# Patient Record
Sex: Female | Born: 1990 | Race: Black or African American | Hispanic: No | Marital: Single | Smoking: Never smoker
Health system: Southern US, Community
[De-identification: ages and names within clinical notes are randomized; demographics above are authoritative.]

## PROBLEM LIST (undated history)

## (undated) DIAGNOSIS — F32A Depression, unspecified: Secondary | ICD-10-CM

## (undated) DIAGNOSIS — K5900 Constipation, unspecified: Secondary | ICD-10-CM

## (undated) DIAGNOSIS — F419 Anxiety disorder, unspecified: Secondary | ICD-10-CM

## (undated) HISTORY — DX: Constipation, unspecified: K59.00

## (undated) HISTORY — PX: WISDOM TOOTH EXTRACTION: SHX21

---
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
## 2012-08-06 ENCOUNTER — Encounter (HOSPITAL_COMMUNITY): Payer: Self-pay | Admitting: Emergency Medicine

## 2012-08-06 ENCOUNTER — Emergency Department (HOSPITAL_COMMUNITY)
Admission: EM | Admit: 2012-08-06 | Discharge: 2012-08-06 | Disposition: A | Discharge status: Home / Self Care | Payer: No Typology Code available for payment source | Attending: Emergency Medicine | Admitting: Emergency Medicine

## 2012-08-06 DIAGNOSIS — Z3202 Encounter for pregnancy test, result negative: Secondary | ICD-10-CM | POA: Insufficient documentation

## 2012-08-06 DIAGNOSIS — R110 Nausea: Secondary | ICD-10-CM | POA: Insufficient documentation

## 2012-08-06 DIAGNOSIS — R109 Unspecified abdominal pain: Secondary | ICD-10-CM | POA: Insufficient documentation

## 2012-08-06 DIAGNOSIS — T7421XA Adult sexual abuse, confirmed, initial encounter: Secondary | ICD-10-CM | POA: Insufficient documentation

## 2012-08-06 LAB — PREGNANCY, URINE: Preg Test, Ur: NEGATIVE

## 2012-08-06 MED ORDER — PROMETHAZINE HCL 25 MG PO TABS
ORAL_TABLET | ORAL | Status: AC
  Filled 2012-08-06: qty 3

## 2012-08-06 MED ORDER — AZITHROMYCIN 1 G PO PACK
PACK | ORAL | Status: AC
  Administered 2012-08-06: 1 g via ORAL
  Filled 2012-08-06: qty 1

## 2012-08-06 MED ORDER — CEFIXIME 400 MG PO TABS
ORAL_TABLET | ORAL | Status: AC
  Administered 2012-08-06: 400 mg
  Filled 2012-08-06: qty 1

## 2012-08-06 MED ORDER — METRONIDAZOLE 500 MG PO TABS
ORAL_TABLET | ORAL | Status: AC
  Filled 2012-08-06: qty 4

## 2012-08-06 MED ORDER — LEVONORGESTREL 0.75 MG PO TABS
ORAL_TABLET | ORAL | Status: AC
  Administered 2012-08-06: 1.5 mg via ORAL
  Filled 2012-08-06: qty 2
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
## 2012-08-06 NOTE — ED Notes (Signed)
Talked to SANE nurse Heide Guile Rn her shift is ending and Junious Dresser will be here after 7pm
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
## 2015-10-08 ENCOUNTER — Other Ambulatory Visit (HOSPITAL_COMMUNITY): Payer: Self-pay | Admitting: Gastroenterology

## 2015-10-08 DIAGNOSIS — R110 Nausea: Secondary | ICD-10-CM

## 2015-10-08 DIAGNOSIS — R6881 Early satiety: Secondary | ICD-10-CM

## 2015-10-11 ENCOUNTER — Encounter (HOSPITAL_COMMUNITY): Payer: BLUE CROSS/BLUE SHIELD

## 2018-01-19 DIAGNOSIS — K649 Unspecified hemorrhoids: Secondary | ICD-10-CM | POA: Insufficient documentation

## 2018-02-03 DIAGNOSIS — R87610 Atypical squamous cells of undetermined significance on cytologic smear of cervix (ASC-US): Secondary | ICD-10-CM | POA: Insufficient documentation

## 2018-03-16 DIAGNOSIS — R072 Precordial pain: Secondary | ICD-10-CM | POA: Insufficient documentation

## 2018-03-16 DIAGNOSIS — N941 Unspecified dyspareunia: Secondary | ICD-10-CM | POA: Insufficient documentation

## 2019-06-27 ENCOUNTER — Other Ambulatory Visit: Payer: Self-pay

## 2019-06-27 ENCOUNTER — Encounter (HOSPITAL_COMMUNITY): Payer: Self-pay

## 2019-06-27 ENCOUNTER — Ambulatory Visit (HOSPITAL_COMMUNITY)
Admission: EM | Admit: 2019-06-27 | Discharge: 2019-06-27 | Disposition: A | Discharge status: Home / Self Care | Payer: BC Managed Care – PPO | Attending: Family Medicine | Admitting: Family Medicine

## 2019-06-27 DIAGNOSIS — R109 Unspecified abdominal pain: Secondary | ICD-10-CM | POA: Diagnosis not present

## 2019-06-27 DIAGNOSIS — R3129 Other microscopic hematuria: Secondary | ICD-10-CM | POA: Diagnosis present

## 2019-06-27 LAB — POCT URINALYSIS DIP (DEVICE)
Bilirubin Urine: NEGATIVE
Glucose, UA: NEGATIVE mg/dL
Ketones, ur: NEGATIVE mg/dL
Leukocytes,Ua: NEGATIVE
Nitrite: NEGATIVE
Protein, ur: NEGATIVE mg/dL
Specific Gravity, Urine: >=1.03 (ref 1.005–1.030)
Urobilinogen, UA: 0.2 mg/dL (ref 0.0–1.0)
pH: 7 (ref 5.0–8.0)

## 2019-06-27 LAB — BASIC METABOLIC PANEL
Anion gap: 9 (ref 5–15)
BUN: 9 mg/dL (ref 6–20)
CO2: 27 mmol/L (ref 22–32)
Calcium: 9.4 mg/dL (ref 8.9–10.3)
Chloride: 103 mmol/L (ref 98–111)
Creatinine, Ser: 0.72 mg/dL (ref 0.44–1.00)
GFR calc Af Amer: >60 mL/min (ref >60)
GFR calc non Af Amer: >60 mL/min (ref >60)
Glucose, Bld: 112 mg/dL — ABNORMAL HIGH (ref 70–99)
Potassium: 3.4 mmol/L — ABNORMAL LOW (ref 3.5–5.1)
Sodium: 139 mmol/L (ref 135–145)

## 2019-06-27 NOTE — ED Triage Notes (Signed)
Pt states she has had blood in her urine and its cloudy. X 1 month

## 2019-06-27 NOTE — Discharge Instructions (Signed)
You do still have microscopic blood to your urine but no other indications of UTI at this time.  I will check your kidney function with blood test as well as culture your urine.  Will notify of any concerning findings and if any changes to treatment are needed.  You may monitor your results on your MyChart online as well.   Ibuprofen or naproxen as needed for pain.  Please follow up with urology and/or your primary care provider for continued evaluation of your symptoms.

## 2019-06-28 ENCOUNTER — Telehealth (HOSPITAL_COMMUNITY): Payer: Self-pay | Admitting: Emergency Medicine

## 2019-06-28 LAB — URINE CULTURE: Culture: <10000 — AB

## 2019-06-28 NOTE — ED Provider Notes (Signed)
[place]    CSN: [id number] Arrival date & time: 06/27/19  1947      History   Chief Complaint Chief Complaint  Patient presents with  . Urinary Tract Infection    HPI Karen Sharp is a 28 y.o. female.   Karen Sharp presents with complaints of persistent symptoms of abdominal cramping, low back pain. Symptoms for over a month now. Has been working with her PCP without resolution or conclusion of symptoms, unfortunately. She has had microscopic hematuria with her PCP, although she was on her period and has had issues with hemorrhoids therefore it was not always reliable source of hematuria. Otherwise negative ua's and cultures. Had an abdominal CT completed which was also negative, specifically for kidney stones. She has had some urinary frequency but more often the sensation of inadequate bladder emptying. No fevers. No nausea or vomiting. Headaches. Has started to use miralax to help with constipation, which has helped. No issues with bleeding hemorrhoids today. LMP 10/16. Pelvic ultrasound 9/4 through her OB, without acute findings. Their note does mention concern for potential chronic pelvic pain, with previous ultrasound completed in may 2019. She is not taking any medications regularly, states she doesn't want to take any nsaids to mask her symptoms. Without contributing medical history.      ROS per HPI, negative if not otherwise mentioned.      History reviewed. No pertinent past medical history.  There are no active problems to display for this patient.   History reviewed. No pertinent surgical history.  OB History   No obstetric history on file.      Home Medications    Prior to Admission medications   Not on File    Family History History reviewed. No pertinent family history.  Social History Social History   Tobacco Use  . Smoking status: Never Smoker  . Smokeless tobacco: Never Used  Substance Use Topics  . Alcohol use: Yes   . Drug use: Yes    Types: Marijuana     Allergies   Patient has no known allergies.   Review of Systems Review of Systems   Physical Exam Triage Vital Signs ED Triage Vitals  Enc Vitals Group     BP 06/27/19 2020 132/82     Pulse Rate 06/27/19 2020 82     Resp 06/27/19 2020 16     Temp 06/27/19 2020 97.8 F (36.6 C)     Temp Source 06/27/19 2020 Oral     SpO2 06/27/19 2020 100 %     Weight 06/27/19 2019 113 lb (51.3 kg)     Height --      Head Circumference --      Peak Flow --      Pain Score 06/27/19 2020 5     Pain Loc --      Pain Edu? --      Excl. in [place]? --    No data found.  Updated Vital Signs BP 132/82 (BP Location: Right Arm)   Pulse 82   Temp 97.8 F (36.6 C) (Oral)   Resp 16   Wt 113 lb (51.3 kg)   LMP 06/17/2019   SpO2 100%   BMI 19.40 kg/m    Physical Exam Constitutional:      General: She is not in acute distress.    Appearance: She is well-developed.  Cardiovascular:     Rate and Rhythm: Normal rate.  Pulmonary:     Effort: Pulmonary effort is  normal.  Abdominal:     Tenderness: There is abdominal tenderness in the suprapubic area. There is no right CVA tenderness or left CVA tenderness.     Comments: Mild low abdomen/l suprapubic discomfort on exam without rebound or guarding   Skin:    General: Skin is warm and dry.  Neurological:     Mental Status: She is alert and oriented to person, place, and time.      UC Treatments / Results  Labs (all labs ordered are listed, but only abnormal results are displayed) Labs Reviewed  BASIC METABOLIC PANEL - Abnormal; Notable for the following components:      Result Value   Potassium 3.4 (*)    Glucose, Bld 112 (*)    All other components within normal limits  POCT URINALYSIS DIP (DEVICE) - Abnormal; Notable for the following components:   Hgb urine dipstick MODERATE (*)    All other components within normal limits  URINE CULTURE    EKG   Radiology No results found.   Procedures Procedures (including critical care time)  Medications Ordered in UC Medications - No data to display  Initial Impression / Assessment and Plan / UC Course  I have reviewed the triage vital signs and the nursing notes.  Pertinent labs & imaging results that were available during my care of the patient were reviewed by me and considered in my medical decision making (see chart for details).     UA still with hgb noted, negative CT for stones. No other source of infection obvious at this time, urine culture collected and pending as well. Bmp to assess creatinine as well, it is WNL. Symptoms for over a month. Afebrile. No gi symptoms. No red flag findings here tonight but patient recommended to certainly continue to work with PCP for further evaluation and management of her discomfort. Return precautions provided. Patient verbalized understanding and agreeable to plan.  Ambulatory out of clinic without difficulty.    Final Clinical Impressions(s) / UC Diagnoses   Final diagnoses:  Abdominal cramping  Microscopic hematuria     Discharge Instructions     You do still have microscopic blood to your urine but no other indications of UTI at this time.  I will check your kidney function with blood test as well as culture your urine.  Will notify of any concerning findings and if any changes to treatment are needed.  You may monitor your results on your MyChart online as well.   Ibuprofen or naproxen as needed for pain.  Please follow up with urology and/or your primary care provider for continued evaluation of your symptoms.    ED Prescriptions    None     PDMP not reviewed this encounter.   Georgetta Haber, NP 06/28/19 [phone]

## 2019-06-28 NOTE — Telephone Encounter (Signed)
Labs unremarkable, notified patient of lab work, encouraged increase in potassium diet, otherwise labs normal. All questions answered.

## 2019-11-13 ENCOUNTER — Encounter (HOSPITAL_COMMUNITY): Payer: Self-pay

## 2019-11-13 ENCOUNTER — Other Ambulatory Visit: Payer: Self-pay

## 2019-11-13 ENCOUNTER — Emergency Department (HOSPITAL_COMMUNITY): Payer: Self-pay

## 2019-11-13 ENCOUNTER — Emergency Department (HOSPITAL_COMMUNITY)
Admission: EM | Admit: 2019-11-13 | Discharge: 2019-11-13 | Disposition: A | Discharge status: Home / Self Care | Payer: Self-pay | Attending: Emergency Medicine | Admitting: Emergency Medicine

## 2019-11-13 ENCOUNTER — Ambulatory Visit (HOSPITAL_COMMUNITY)
Admission: EM | Admit: 2019-11-13 | Discharge: 2019-11-13 | Disposition: A | Discharge status: Home / Self Care | Payer: BC Managed Care – PPO

## 2019-11-13 DIAGNOSIS — W0110XA Fall on same level from slipping, tripping and stumbling with subsequent striking against unspecified object, initial encounter: Secondary | ICD-10-CM | POA: Insufficient documentation

## 2019-11-13 DIAGNOSIS — S20312A Abrasion of left front wall of thorax, initial encounter: Secondary | ICD-10-CM | POA: Insufficient documentation

## 2019-11-13 DIAGNOSIS — R42 Dizziness and giddiness: Secondary | ICD-10-CM | POA: Insufficient documentation

## 2019-11-13 DIAGNOSIS — W19XXXA Unspecified fall, initial encounter: Secondary | ICD-10-CM

## 2019-11-13 DIAGNOSIS — S0081XA Abrasion of other part of head, initial encounter: Secondary | ICD-10-CM | POA: Insufficient documentation

## 2019-11-13 DIAGNOSIS — Z23 Encounter for immunization: Secondary | ICD-10-CM | POA: Insufficient documentation

## 2019-11-13 DIAGNOSIS — R519 Headache, unspecified: Secondary | ICD-10-CM | POA: Insufficient documentation

## 2019-11-13 DIAGNOSIS — Y9389 Activity, other specified: Secondary | ICD-10-CM | POA: Insufficient documentation

## 2019-11-13 DIAGNOSIS — R110 Nausea: Secondary | ICD-10-CM | POA: Insufficient documentation

## 2019-11-13 DIAGNOSIS — Y929 Unspecified place or not applicable: Secondary | ICD-10-CM | POA: Insufficient documentation

## 2019-11-13 DIAGNOSIS — Y999 Unspecified external cause status: Secondary | ICD-10-CM | POA: Insufficient documentation

## 2019-11-13 LAB — POC URINE PREG, ED: Preg Test, Ur: NEGATIVE

## 2019-11-13 MED ORDER — KETOROLAC TROMETHAMINE 15 MG/ML IJ SOLN
15.0000 mg | Freq: Once | INTRAMUSCULAR | Status: AC
  Administered 2019-11-13: 15 mg via INTRAVENOUS
  Filled 2019-11-13: qty 1

## 2019-11-13 MED ORDER — BACITRACIN ZINC 500 UNIT/GM EX OINT: TOPICAL_OINTMENT | Freq: Two times a day (BID) | CUTANEOUS | Status: DC

## 2019-11-13 MED ORDER — SODIUM CHLORIDE 0.9 % IV BOLUS
1000.0000 mL | Freq: Once | INTRAVENOUS | Status: AC
  Administered 2019-11-13: 1000 mL via INTRAVENOUS

## 2019-11-13 MED ORDER — TETANUS-DIPHTH-ACELL PERTUSSIS 5-2.5-18.5 LF-MCG/0.5 IM SUSP
0.5000 mL | Freq: Once | INTRAMUSCULAR | Status: AC
  Administered 2019-11-13: 0.5 mL via INTRAMUSCULAR
  Filled 2019-11-13: qty 0.5

## 2019-11-13 MED ORDER — ONDANSETRON HCL 4 MG/2ML IJ SOLN
4.0000 mg | Freq: Once | INTRAMUSCULAR | Status: AC
  Administered 2019-11-13: 4 mg via INTRAVENOUS
  Filled 2019-11-13: qty 2

## 2019-11-13 NOTE — ED Notes (Signed)
Pt. Transported to xray 
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
## 2019-11-13 NOTE — ED Notes (Signed)
Pt verbalized understanding of DC instructions and follow up care.  
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
## 2019-11-24 DIAGNOSIS — L209 Atopic dermatitis, unspecified: Secondary | ICD-10-CM | POA: Insufficient documentation

## 2019-11-24 DIAGNOSIS — F4323 Adjustment disorder with mixed anxiety and depressed mood: Secondary | ICD-10-CM | POA: Insufficient documentation

## 2020-03-27 ENCOUNTER — Other Ambulatory Visit: Payer: Self-pay

## 2020-03-27 ENCOUNTER — Emergency Department (HOSPITAL_COMMUNITY)
Admission: EM | Admit: 2020-03-27 | Discharge: 2020-03-27 | Disposition: A | Discharge status: Home / Self Care | Payer: PRIVATE HEALTH INSURANCE | Attending: Emergency Medicine | Admitting: Emergency Medicine

## 2020-03-27 ENCOUNTER — Emergency Department (HOSPITAL_COMMUNITY): Payer: PRIVATE HEALTH INSURANCE

## 2020-03-27 DIAGNOSIS — G479 Sleep disorder, unspecified: Secondary | ICD-10-CM | POA: Diagnosis not present

## 2020-03-27 DIAGNOSIS — F1210 Cannabis abuse, uncomplicated: Secondary | ICD-10-CM | POA: Insufficient documentation

## 2020-03-27 DIAGNOSIS — R400 Somnolence: Secondary | ICD-10-CM

## 2020-03-27 LAB — URINALYSIS, ROUTINE W REFLEX MICROSCOPIC
Bilirubin Urine: NEGATIVE
Glucose, UA: NEGATIVE mg/dL
Ketones, ur: NEGATIVE mg/dL
Leukocytes,Ua: NEGATIVE
Nitrite: NEGATIVE
Protein, ur: NEGATIVE mg/dL
Specific Gravity, Urine: 1.025 (ref 1.005–1.030)
pH: 6 (ref 5.0–8.0)

## 2020-03-27 LAB — URINALYSIS, MICROSCOPIC (REFLEX)
Bacteria, UA: NONE SEEN
WBC, UA: NONE SEEN WBC/hpf (ref 0–5)

## 2020-03-27 LAB — CBC WITH DIFFERENTIAL/PLATELET
Abs Immature Granulocytes: 0.01 10*3/uL (ref 0.00–0.07)
Basophils Absolute: 0 10*3/uL (ref 0.0–0.1)
Basophils Relative: 0 %
Eosinophils Absolute: 0.2 10*3/uL (ref 0.0–0.5)
Eosinophils Relative: 2 %
HCT: 35.6 % — ABNORMAL LOW (ref 36.0–46.0)
Hemoglobin: 11.9 g/dL — ABNORMAL LOW (ref 12.0–15.0)
Immature Granulocytes: 0 %
Lymphocytes Relative: 36 %
Lymphs Abs: 2.7 10*3/uL (ref 0.7–4.0)
MCH: 30.5 pg (ref 26.0–34.0)
MCHC: 33.4 g/dL (ref 30.0–36.0)
MCV: 91.3 fL (ref 80.0–100.0)
Monocytes Absolute: 0.6 10*3/uL (ref 0.1–1.0)
Monocytes Relative: 8 %
Neutro Abs: 4.1 10*3/uL (ref 1.7–7.7)
Neutrophils Relative %: 54 %
Platelets: 192 10*3/uL (ref 150–400)
RBC: 3.9 MIL/uL (ref 3.87–5.11)
RDW: 12.5 % (ref 11.5–15.5)
WBC: 7.7 10*3/uL (ref 4.0–10.5)
nRBC: 0 % (ref 0.0–0.2)

## 2020-03-27 LAB — COMPREHENSIVE METABOLIC PANEL
ALT: 16 U/L (ref 0–44)
AST: 22 U/L (ref 15–41)
Albumin: 3.9 g/dL (ref 3.5–5.0)
Alkaline Phosphatase: 41 U/L (ref 38–126)
Anion gap: 7 (ref 5–15)
BUN: 11 mg/dL (ref 6–20)
CO2: 27 mmol/L (ref 22–32)
Calcium: 9.3 mg/dL (ref 8.9–10.3)
Chloride: 105 mmol/L (ref 98–111)
Creatinine, Ser: 0.62 mg/dL (ref 0.44–1.00)
GFR calc Af Amer: >60 mL/min (ref >60)
GFR calc non Af Amer: >60 mL/min (ref >60)
Glucose, Bld: 89 mg/dL (ref 70–99)
Potassium: 3.9 mmol/L (ref 3.5–5.1)
Sodium: 139 mmol/L (ref 135–145)
Total Bilirubin: 0.5 mg/dL (ref 0.3–1.2)
Total Protein: 7 g/dL (ref 6.5–8.1)

## 2020-03-27 LAB — RAPID URINE DRUG SCREEN, HOSP PERFORMED
Amphetamines: NOT DETECTED
Barbiturates: NOT DETECTED
Benzodiazepines: NOT DETECTED
Cocaine: NOT DETECTED
Opiates: NOT DETECTED
Tetrahydrocannabinol: POSITIVE — AB

## 2020-03-27 LAB — I-STAT BETA HCG BLOOD, ED (MC, WL, AP ONLY): I-stat hCG, quantitative: <5 m[IU]/mL (ref <5)
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
## 2020-03-27 NOTE — Discharge Instructions (Addendum)
Do not drive, operate heavy machinery, cook over an open flame or swim until cleared by neurology.  Avoid activities which would be dangerous if you fell asleep while doing these activities.  Return to the emergency room immediately for new or worsening symptoms or concerns, such as chest pain, shortness of breath, vomiting, fevers or any concerns at all.
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
## 2020-03-27 NOTE — ED Notes (Signed)
Patient made aware a UA is needed,stated she just used restroom prior to UA being ordered

## 2020-03-27 NOTE — ED Notes (Signed)
ISTAT beta result negative (<5). 

## 2020-09-14 ENCOUNTER — Other Ambulatory Visit: Payer: Self-pay | Admitting: Nurse Practitioner

## 2020-09-14 DIAGNOSIS — R102 Pelvic and perineal pain: Secondary | ICD-10-CM

## 2020-09-17 ENCOUNTER — Encounter: Payer: PRIVATE HEALTH INSURANCE | Admitting: Women's Health

## 2020-10-01 ENCOUNTER — Encounter: Payer: Self-pay | Admitting: Women's Health

## 2020-10-01 ENCOUNTER — Other Ambulatory Visit (HOSPITAL_COMMUNITY)
Admission: RE | Admit: 2020-10-01 | Discharge: 2020-10-01 | Disposition: A | Discharge status: Home / Self Care | Payer: Medicaid Other | Source: Ambulatory Visit | Attending: Obstetrics & Gynecology | Admitting: Obstetrics & Gynecology

## 2020-10-01 ENCOUNTER — Ambulatory Visit (INDEPENDENT_AMBULATORY_CARE_PROVIDER_SITE_OTHER): Payer: Self-pay | Admitting: Women's Health

## 2020-10-01 ENCOUNTER — Other Ambulatory Visit: Payer: Self-pay

## 2020-10-01 VITALS — BP 98/60 | HR 86 | Temp 98.2°F | Ht 63.0 in | Wt 117.3 lb

## 2020-10-01 DIAGNOSIS — M5441 Lumbago with sciatica, right side: Secondary | ICD-10-CM | POA: Diagnosis present

## 2020-10-01 DIAGNOSIS — G8929 Other chronic pain: Secondary | ICD-10-CM | POA: Diagnosis present

## 2020-10-01 DIAGNOSIS — M5442 Lumbago with sciatica, left side: Secondary | ICD-10-CM | POA: Insufficient documentation

## 2020-10-01 DIAGNOSIS — N730 Acute parametritis and pelvic cellulitis: Secondary | ICD-10-CM

## 2020-10-01 DIAGNOSIS — M545 Low back pain, unspecified: Secondary | ICD-10-CM

## 2020-10-01 DIAGNOSIS — R102 Pelvic and perineal pain: Secondary | ICD-10-CM

## 2020-10-01 LAB — POCT URINALYSIS DIPSTICK OB
Blood, UA: NEGATIVE
Glucose, UA: NEGATIVE
Ketones, UA: NEGATIVE
Leukocytes, UA: NEGATIVE
Nitrite, UA: NEGATIVE

## 2020-10-01 MED ORDER — METRONIDAZOLE 500 MG PO TABS: 500.0000 mg | ORAL_TABLET | Freq: Two times a day (BID) | ORAL | 0 refills | Status: DC

## 2020-10-01 MED ORDER — CEFTRIAXONE SODIUM 500 MG IJ SOLR
500.0000 mg | Freq: Once | INTRAMUSCULAR | Status: AC
  Administered 2020-10-01: 500 mg via INTRAMUSCULAR

## 2020-10-01 MED ORDER — DOXYCYCLINE HYCLATE 100 MG PO CAPS: 100.0000 mg | ORAL_CAPSULE | Freq: Two times a day (BID) | ORAL | 0 refills | Status: DC

## 2020-10-01 MED ORDER — CEFTRIAXONE SODIUM 250 MG IJ SOLR: 250.0000 mg | Freq: Once | INTRAMUSCULAR | Status: DC

## 2020-10-01 NOTE — Patient Instructions (Signed)
Pelvic Inflammatory Disease  Pelvic inflammatory disease (PID) is caused by an infection in some or all of the female reproductive organs. The infection can be in the uterus, ovaries, fallopian tubes, or the surrounding tissues in the pelvis. PID can cause abdominal or pelvic pain that comes on suddenly (acute pelvic pain). PID is a serious infection because it can lead to lasting (chronic) pelvic pain or the inability to have children (infertility). What are the causes? This condition is most often caused by bacteria that is spread during sexual contact. It can also be caused by a bacterial infection of the vagina (bacterial vaginosis) that is not spread by sexual contact. This condition occurs when the infection is not treated and the bacteria travel upward from the vagina or cervix into the reproductive organs. Bacteria may also be introduced into the reproductive organs following:  The birth of a baby.  A miscarriage.  An abortion.  Major pelvic surgery.  The insertion of an intrauterine device (IUD).  A sexual assault. What increases the risk? You are more likely to develop this condition if you:  Are younger than 28 years of age.  Are sexually active at a young age.  Have a history of STI (sexually transmitted infection) or PID.  Do not regularly use barrier contraception methods, such as condoms.  Have multiple sexual partners.  Have sex with someone who has symptoms of an STI.  Use a vaginal douche.  Have recently had an IUD inserted. What are the signs or symptoms? Symptoms of this condition include:  Abdominal or pelvic pain.  Fever.  Chills.  Abnormal vaginal discharge.  Abnormal uterine bleeding.  Unusual pain shortly after the end of a menstrual period.  Painful urination.  Pain with sex.  Nausea and vomiting. How is this diagnosed? This condition is diagnosed based on a pelvic exam and medical history. A pelvic exam can reveal signs of  infection, inflammation, and discharge in the vagina and the surrounding tissues. It can also help to identify painful areas. You may also have tests, such as:  Lab tests, including a pregnancy test, blood tests, and a urine test.  Culture tests of the vagina and cervix to check for an STI.  Ultrasound.  A laparoscopic procedure to look inside the pelvis.  Examination of vaginal discharge under a microscope. How is this treated? This condition may be treated with:  Antibiotic medicines taken by mouth (orally). For more severe cases, antibiotics may be given through an IV at the hospital.  Surgery. This is rare. Surgery may be needed if other treatments do not help.  Efforts to stop the spread of the infection. Sexual partners may need to be treated if the infection is caused by an STI. It may take weeks until you are completely well. If you are diagnosed with PID, you should also be checked for HIV (human immunodeficiency virus). Your health care provider may test you for infection again 3 months after treatment. You should not have unprotected sex. Follow these instructions at home:  Take over-the-counter and prescription medicines only as told by your health care provider.  If you were prescribed an antibiotic medicine, take it as told by your health care provider. Do not stop using the antibiotic even if you start to feel better.  Do not have sex until treatment is completed or as told by your health care provider. If PID is confirmed, your recent sexual partners will need treatment, especially if you had unprotected sex.  Keep all   follow-up visits as told by your health care provider. This is important. Contact a health care provider if:  You have increased or abnormal vaginal discharge.  Your pain does not improve.  You vomit.  You have a fever.  You cannot tolerate your medicines.  Your partner has an STI.  You have pain when you urinate. Get help right away  if:  You have increased abdominal or pelvic pain.  You have chills.  Your symptoms are not better in 72 hours with treatment. Summary  Pelvic inflammatory disease (PID) is caused by an infection in some or all of the female reproductive organs.  PID is a serious infection because it can lead to lasting (chronic) pelvic pain or the inability to have children (infertility).  This infection is usually treated with antibiotic medicines.  Do not have sex until treatment is completed or as told by your health care provider. This information is not intended to replace advice given to you by your health care provider. Make sure you discuss any questions you have with your health care provider. Document Revised: 05/06/2018 Document Reviewed: 05/11/2018 [employer]vier Patient Education  2021 [employer]  

## 2020-10-01 NOTE — Progress Notes (Signed)
GYN VISIT Patient name: Karen Sharp MRN [id number]  Date of birth: [birth year] Chief Complaint:   lower abd pain, severe cramps (And back pain)  History of Present Illness:   Karen Sharp is a 30 y.o. G0 African American female being seen today for report of intermittent severe low abd and low back pain x ~83mth. No fever/chills. Some nausea when pain severe. No vomiting. Uses pain patch on back which helps some. Denies abnormal discharge, itching/odor/irritation.  Doesn't like taking medicine. Concerned about fertility, was having unprotected sex w/ previous boyfriend and did not conceive. Periods are regular, 31d cycles per her app.  Depression screen [facility] 2/9 10/01/2020  Decreased Interest 0  Down, Depressed, Hopeless 0  PHQ - 2 Score 0  Altered sleeping 0  Tired, decreased energy 0  Change in appetite 0  Feeling bad or failure about yourself  0  Trouble concentrating 0  Moving slowly or fidgety/restless 0  Suicidal thoughts 0  PHQ-9 Score 0    Patient's last menstrual period was 09/30/2020. The current method of family planning is none.  Last pap 2021 in [place]. Results were: normal per her report. Does have h/o abnormal pap. Had transvaginal u/s after her pap which showed ovarian cysts Review of Systems:   Pertinent items are noted in HPI Denies fever/chills, dizziness, headaches, visual disturbances, fatigue, shortness of breath, chest pain, abdominal pain, vomiting, abnormal vaginal discharge/itching/odor/irritation, problems with periods, bowel movements, urination, or intercourse unless otherwise stated above.  Pertinent History Reviewed:  Reviewed past medical,surgical, social, obstetrical and family history.  Reviewed problem list, medications and allergies. Physical Assessment:   Vitals:   10/01/20 1150  BP: 98/60  Pulse: 86  Temp: 98.2 F (36.8 C)  Weight: 117 lb 4.8 oz (53.2 kg)  Height: 5\' 3"  (1.6 m)  Body mass index is 20.78 kg/m.       Physical  Examination:   General appearance: alert, well appearing, and in no distress  Mental status: alert, oriented to person, place, and time  Skin: warm & dry   Cardiovascular: normal heart rate noted  Respiratory: normal respiratory effort, no distress  Abdomen: soft, non-tender   Pelvic: VULVA: normal appearing vulva with no masses, tenderness or lesions, VAGINA: normal appearing vagina with normal color and discharge, no lesions, normal menstrual blood CERVIX: normal appearing cervix without discharge or lesions, +CMT  UTERUS: uterus is normal size, shape, consistency and mildly tender, ADNEXA: normal adnexa in size, mild tenderness LT >Rt, and no masses  Extremities: no edema   Chaperone:    Results for orders placed or performed in visit on 10/01/20 (from the past 24 hour(s))  POC Urinalysis Dipstick OB   Collection Time: 10/01/20 12:03 PM  Result Value Ref Range   Color, UA     Clarity, UA     Glucose, UA Negative Negative   Bilirubin, UA     Ketones, UA neg    Spec Grav, UA     Blood, UA neg    pH, UA     POC,PROTEIN,UA Small (1+) Negative, Trace, Small (1+), Moderate (2+), Large (3+), 4+   Urobilinogen, UA     Nitrite, UA neg    Leukocytes, UA Negative Negative   Appearance     Odor      Assessment & Plan:  1) Presumed PID> based on sx and exam. Rocephin 500mg  IM x 1 here, rx flagyl BID x 14d and doxy BID x 14d. CV swab sent. Let  us know if sx not improved. If worsens let us know, go to hospital for eval  2) Regular periods> q 31d on average. Not currently having sex/trying to conceive  3) H/O abnormal pap> will get records from [place]  Meds:  Meds ordered this encounter  Medications  . doxycycline (VIBRAMYCIN) 100 MG capsule    Sig: Take 1 capsule (100 mg total) by mouth 2 (two) times daily.    Dispense:  28 capsule    Refill:  0    Order Specific Question:   Supervising Provider    Answer:   EURE, LUTHER H [[id number]]  . metroNIDAZOLE (FLAGYL) 500 MG tablet     Sig: Take 1 tablet (500 mg total) by mouth 2 (two) times daily.    Dispense:  28 tablet    Refill:  0    Order Specific Question:   Supervising Provider    Answer:   Despina Hidden, LUTHER H [[id number]]  . cefTRIAXone (ROCEPHIN) injection 500 mg  . DISCONTD: cefTRIAXone (ROCEPHIN) injection 250 mg    Orders Placed This Encounter  Procedures  . POC Urinalysis Dipstick OB    Return for will call pt w/ results; get pap records from [facility].  Cheral Marker CNM, [facility] 10/01/2020 1:09 PM

## 2020-10-02 LAB — CERVICOVAGINAL ANCILLARY ONLY
Bacterial Vaginitis (gardnerella): NEGATIVE
Chlamydia: NEGATIVE
Comment: NEGATIVE
Comment: NEGATIVE
Comment: NEGATIVE
Comment: NORMAL
Neisseria Gonorrhea: NEGATIVE
Trichomonas: NEGATIVE

## 2020-10-05 ENCOUNTER — Telehealth: Payer: Self-pay | Admitting: Women's Health

## 2020-10-05 NOTE — Telephone Encounter (Signed)
Returned pt's call. Pt states she is having mouth ulcers, sore throat, burning with urination, lower back pain, cramping, headaches, irritability, and body aches, since starting the antibiotics prescribed. Informed pt a msg would be sent to the provider for advise.

## 2020-10-05 NOTE — Telephone Encounter (Signed)
Patient Called stating that she had ana appointment with kim not to long ago and she placed her on some medication, patient would like to speak with either the nurse or kim regarding the side effects that the medication is causing her. Please contact pt

## 2020-10-17 ENCOUNTER — Other Ambulatory Visit: Payer: Self-pay | Admitting: Women's Health

## 2020-10-17 ENCOUNTER — Telehealth: Payer: Self-pay | Admitting: Women's Health

## 2020-10-17 ENCOUNTER — Telehealth: Payer: Self-pay | Admitting: Radiology

## 2020-10-17 ENCOUNTER — Encounter: Payer: Self-pay | Admitting: Radiology

## 2020-10-17 MED ORDER — FLUCONAZOLE 150 MG PO TABS: 150.0000 mg | ORAL_TABLET | Freq: Once | ORAL | 0 refills | Status: AC

## 2020-10-17 NOTE — Telephone Encounter (Signed)
Called pt to inform her that her requested prescription was sent. Had to leave a vm. Pt then returned the call stating that she needed it sent to a different pharmacy. Returned pt's call to inform her that she would need to contact the pharmacy directly and set it up for a transfer. Pt's pharmacy info is changed in the chart now per pt request. Pt confirmed understanding.

## 2020-10-17 NOTE — Telephone Encounter (Signed)
Patient stated that after finishing her antibiotics she has developed a yeast infection and wants to know if a prescription could be called into her pharmacy or does she have to come in to be seen. Clinical staff will follow up with patient.

## 2020-10-17 NOTE — Telephone Encounter (Signed)
Pt would like [place] to send her Rx that we ordered today to [facility] on [address]  Please advise & notify pt

## 2020-10-17 NOTE — Telephone Encounter (Signed)
Left voicemail for patient to call [facility]  Records scanned

## 2020-11-19 ENCOUNTER — Telehealth: Payer: Self-pay | Admitting: Radiology

## 2020-11-19 NOTE — Telephone Encounter (Signed)
Spoke with Patient who was [facility] that we do not accept New Gyn patient that does not have insurance. Patient is established patient with [facility] in [place], Instructed patient to follow up with that office for pelvic pain.

## 2020-11-21 ENCOUNTER — Encounter: Payer: PRIVATE HEALTH INSURANCE | Admitting: Family Medicine

## 2021-04-01 HISTORY — PX: COLONOSCOPY: SHX174

## 2021-05-17 ENCOUNTER — Ambulatory Visit (INDEPENDENT_AMBULATORY_CARE_PROVIDER_SITE_OTHER): Payer: Self-pay | Admitting: Women's Health

## 2021-05-17 ENCOUNTER — Other Ambulatory Visit: Payer: Self-pay

## 2021-05-17 ENCOUNTER — Other Ambulatory Visit (HOSPITAL_COMMUNITY)
Admission: RE | Admit: 2021-05-17 | Discharge: 2021-05-17 | Disposition: A | Discharge status: Home / Self Care | Payer: Medicaid Other | Source: Ambulatory Visit | Attending: Women's Health | Admitting: Women's Health

## 2021-05-17 ENCOUNTER — Encounter: Payer: Self-pay | Admitting: Women's Health

## 2021-05-17 VITALS — BP 95/59 | HR 106 | Ht 63.0 in | Wt 123.4 lb

## 2021-05-17 DIAGNOSIS — Z113 Encounter for screening for infections with a predominantly sexual mode of transmission: Secondary | ICD-10-CM | POA: Diagnosis present

## 2021-05-17 DIAGNOSIS — R102 Pelvic and perineal pain: Secondary | ICD-10-CM | POA: Diagnosis present

## 2021-05-17 NOTE — Progress Notes (Signed)
   GYN VISIT Patient name: Karen Sharp MRN [id number]  Date of birth: March 03, 1991 Chief Complaint:   Pelvic Pain  History of Present Illness:   Karen Sharp is a 30 y.o. G0 African-American female being seen today for report of continued pelvic pain and low back pain.  Treated for PID in Jan, feels like tx helped sx a lot. Now having intermittent lower pelvic pain and low back pain. Happens sometimes before and after BM, sometimes randomly. Not sexually active. Some vaginal discharge, no itching/odor/irritation. Had TCS recently, removed polyp, had hemorrhoid banding. Does have h/o constipation, on miralax, has 2bm's/day now. PCP/GI has mentioned IBS.   Had normal pelvic u/s 04/25/21 ordered by PCP. Patient's last menstrual period was 05/04/2021. The current method of family planning is abstinence.  Last pap Sept 2021. Results were: NILM w/ HRHPV not done  Depression screen [facility] 2/9 10/01/2020  Decreased Interest 0  Down, Depressed, Hopeless 0  PHQ - 2 Score 0  Altered sleeping 0  Tired, decreased energy 0  Change in appetite 0  Feeling bad or failure about yourself  0  Trouble concentrating 0  Moving slowly or fidgety/restless 0  Suicidal thoughts 0  PHQ-9 Score 0     GAD 7 : Generalized Anxiety Score 10/01/2020  Nervous, Anxious, on Edge 0  Control/stop worrying 0  Worry too much - different things 0  Trouble relaxing 0  Restless 0  Easily annoyed or irritable 0  Afraid - awful might happen 0  Total GAD 7 Score 0     Review of Systems:   Pertinent items are noted in HPI Denies fever/chills, dizziness, headaches, visual disturbances, fatigue, shortness of breath, chest pain, abdominal pain, vomiting, abnormal vaginal discharge/itching/odor/irritation, problems with periods, bowel movements, urination, or intercourse unless otherwise stated above.  Pertinent History Reviewed:  Reviewed past medical,surgical, social, obstetrical and family history.  Reviewed problem list,  medications and allergies. Physical Assessment:   Vitals:   05/17/21 1016  BP: (!) 95/59  Pulse: (!) 106  Weight: 123 lb 6.4 oz (56 kg)  Height: 5\' 3"  (1.6 m)  Body mass index is 21.86 kg/m.       Physical Examination:   General appearance: alert, well appearing, and in no distress  Mental status: alert, oriented to person, place, and time  Skin: warm & dry   Cardiovascular: normal heart rate noted  Respiratory: normal respiratory effort, no distress  Abdomen: soft, non-tender   Pelvic: normal external genitalia, vulva, vagina, cervix, uterus and adnexa  Extremities: no edema   Chaperone: Peggy Dones    No results found for this or any previous visit (from the past 24 hour(s)).  Assessment & Plan:  1) Intermittent lower pelvic/back pain> normal pelvic u/s, normal exam today, CV swab sent. Discussed potential for endometriosis/trialing Orilissa. Pain does happen some around BMs, and PCP/GI have discussed possible IBS- lean more towards this dx  Meds: No orders of the defined types were placed in this encounter.   No orders of the defined types were placed in this encounter.   Return for prn.  CNM, [facility] 05/17/2021 10:47 AM

## 2021-05-20 LAB — CERVICOVAGINAL ANCILLARY ONLY
Bacterial Vaginitis (gardnerella): NEGATIVE
Candida Glabrata: NEGATIVE
Candida Vaginitis: NEGATIVE
Chlamydia: NEGATIVE
Comment: NEGATIVE
Comment: NEGATIVE
Comment: NEGATIVE
Comment: NEGATIVE
Comment: NEGATIVE
Comment: NORMAL
Neisseria Gonorrhea: NEGATIVE
Trichomonas: NEGATIVE

## 2021-07-10 ENCOUNTER — Other Ambulatory Visit (HOSPITAL_COMMUNITY)
Admission: RE | Admit: 2021-07-10 | Discharge: 2021-07-10 | Disposition: A | Discharge status: Home / Self Care | Payer: No Typology Code available for payment source | Source: Ambulatory Visit | Attending: Obstetrics & Gynecology | Admitting: Obstetrics & Gynecology

## 2021-07-10 ENCOUNTER — Other Ambulatory Visit (INDEPENDENT_AMBULATORY_CARE_PROVIDER_SITE_OTHER): Payer: No Typology Code available for payment source

## 2021-07-10 ENCOUNTER — Other Ambulatory Visit: Payer: Self-pay

## 2021-07-10 DIAGNOSIS — R399 Unspecified symptoms and signs involving the genitourinary system: Secondary | ICD-10-CM

## 2021-07-10 DIAGNOSIS — N898 Other specified noninflammatory disorders of vagina: Secondary | ICD-10-CM | POA: Diagnosis present

## 2021-07-10 DIAGNOSIS — Z113 Encounter for screening for infections with a predominantly sexual mode of transmission: Secondary | ICD-10-CM | POA: Diagnosis present

## 2021-07-10 LAB — POCT URINALYSIS DIPSTICK OB
Glucose, UA: NEGATIVE
Ketones, UA: NEGATIVE
Nitrite, UA: NEGATIVE
POC,PROTEIN,UA: NEGATIVE

## 2021-07-10 NOTE — Progress Notes (Addendum)
   NURSE VISIT- VAGINITIS/STD  SUBJECTIVE:  Karen Sharp is a 30 y.o. No obstetric history on file. GYN patientfemale here for a vaginal swab for vaginitis screening, STD screen.  She reports the following symptoms: urinary symptoms of lower abdominal pain and burning with urination and vulvar itching for 2-3 weeks. Denies abnormal vaginal bleeding, significant pelvic pain, fever, or UTI symptoms.  OBJECTIVE:  There were no vitals taken for this visit.  Appears well, in no apparent distress  ASSESSMENT: Vaginal swab for  vaginitis & STD screening  PLAN: Self-collected vaginal probe for Gonorrhea, Chlamydia, Trichomonas, Bacterial Vaginosis, Yeast sent to lab Treatment: to be determined once results are received Follow-up as needed if symptoms persist/worsen, or new symptoms develop    NURSE VISIT- UTI SYMPTOMS   SUBJECTIVE:  Karen Sharp is a 30 y.o. No obstetric history on file. female here for UTI symptoms. She is a GYN patient. She reports hematuria and burning and pain with urination .  OBJECTIVE:  There were no vitals taken for this visit.  Appears well, in no apparent distress  Results for orders placed or performed in visit on 07/10/21 (from the past 24 hour(s))  POC Urinalysis Dipstick OB   Collection Time: 07/10/21 10:54 AM  Result Value Ref Range   Color, UA     Clarity, UA     Glucose, UA Negative Negative   Bilirubin, UA     Ketones, UA neg    Spec Grav, UA     Blood, UA large    pH, UA     POC,PROTEIN,UA Negative Negative, Trace, Small (1+), Moderate (2+), Large (3+), 4+   Urobilinogen, UA     Nitrite, UA neg    Leukocytes, UA Trace (A) Negative   Appearance     Odor      ASSESSMENT: GYN patient with UTI symptoms and negative nitrites  PLAN: Discussed with Joellyn Haff, CNM, [facility]   Rx sent by provider today: No Urine culture sent Call or return to clinic prn if these symptoms worsen or fail to improve as anticipated. Follow-up: as needed    Iann Rodier A Jordon Bourquin  07/10/2021 11:06 AM   Chart reviewed for nurse visit. Agree with plan of care.  Cheral Marker, [place] 07/10/2021 12:57 PM

## 2021-07-11 LAB — CERVICOVAGINAL ANCILLARY ONLY
Bacterial Vaginitis (gardnerella): NEGATIVE
Candida Glabrata: NEGATIVE
Candida Vaginitis: NEGATIVE
Chlamydia: NEGATIVE
Comment: NEGATIVE
Comment: NEGATIVE
Comment: NEGATIVE
Comment: NEGATIVE
Comment: NEGATIVE
Comment: NORMAL
Neisseria Gonorrhea: NEGATIVE
Trichomonas: NEGATIVE

## 2021-07-15 LAB — URINE CULTURE: Organism ID, Bacteria: NO GROWTH

## 2021-07-22 ENCOUNTER — Encounter: Payer: Self-pay | Admitting: Internal Medicine

## 2021-07-22 ENCOUNTER — Ambulatory Visit (INDEPENDENT_AMBULATORY_CARE_PROVIDER_SITE_OTHER): Payer: No Typology Code available for payment source | Admitting: Internal Medicine

## 2021-07-22 ENCOUNTER — Other Ambulatory Visit: Payer: Self-pay

## 2021-07-22 VITALS — BP 118/58 | HR 80 | Resp 16 | Ht 63.0 in | Wt 128.1 lb

## 2021-07-22 DIAGNOSIS — R0789 Other chest pain: Secondary | ICD-10-CM | POA: Insufficient documentation

## 2021-07-22 DIAGNOSIS — K5909 Other constipation: Secondary | ICD-10-CM

## 2021-07-22 DIAGNOSIS — E785 Hyperlipidemia, unspecified: Secondary | ICD-10-CM

## 2021-07-22 DIAGNOSIS — R102 Pelvic and perineal pain: Secondary | ICD-10-CM

## 2021-07-22 DIAGNOSIS — R3129 Other microscopic hematuria: Secondary | ICD-10-CM

## 2021-07-22 DIAGNOSIS — G8929 Other chronic pain: Secondary | ICD-10-CM

## 2021-07-22 DIAGNOSIS — K649 Unspecified hemorrhoids: Secondary | ICD-10-CM | POA: Diagnosis not present

## 2021-07-22 DIAGNOSIS — K5900 Constipation, unspecified: Secondary | ICD-10-CM | POA: Insufficient documentation

## 2021-07-22 NOTE — Patient Instructions (Signed)
Please take Multivitamins as discussed.  Please get fasting blood tests done before the next visit.

## 2021-07-22 NOTE — Assessment & Plan Note (Signed)
Has had urology eval, CT abdomen and US abdomen negative for renal mass, stone or hydronephrosis

## 2021-07-22 NOTE — Assessment & Plan Note (Signed)
Followed by OB/GYN 

## 2021-07-22 NOTE — Assessment & Plan Note (Signed)
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  Chest pain history and chart review are not concerning for anginal pain.

## 2021-07-22 NOTE — Assessment & Plan Note (Signed)
Has had band ligation, has seen colorectal surgeon Advised to take senna or MiraLAX for constipation Needs to improve hydration

## 2021-07-22 NOTE — Progress Notes (Signed)
New Patient Office Visit  Subjective:  Patient ID: Karen Sharp, female    DOB: 1990-10-25  Age: 30 y.o. MRN: [id number]  CC:  Chief Complaint  Patient presents with   New Patient (Initial Visit)    New patient was [facility] pt has been noticing blood in her stool had colonoscopy previously also has noticed blood in her urine and having lower back pain she did have chest pains 2-3 days ago with shoulder pain     HPI Karen Sharp is a 30 y.o. female with past medical history of chronic constipation, hemorrhoids, microscopic hematuria and chronic pelvic pain who presents for establishing care.  She complains of rectal bleeding, which is intermittent.  She has had colonoscopy in 04/2021, which showed internal hemorrhoids and 1 hyperplastic polyp, which was removed.  She also has seen general surgeon, and had banding of the hemorrhoids.  She denies any melena, nausea or vomiting currently.  She has taken senna and MiraLAX for constipation, which have helped.  She also admits that she needs to improve her hydration.  She has seen urologist for microscopic hematuria.  CT abdomen in 2020 and US abdomen in 2022 was negative for renal stones or hydronephrosis.  [facility] sees [facility] for chronic pelvic pain.  She complains of chronic low back pain, which has improved with stretching exercises. Denies any numbness or tingling of the LE.  Denies any recent heavy lifting. She is concerned about pain from kidneys, which is less likely in her case.  She denies any dysuria or gross hematuria currently.  She complains of left-sided chest pain, which is near her shoulder region.  She also complains of pain in the left scapular region, which improves with ibuprofen.  Denies any dyspnea or palpitations currently.  She has had 2 doses of COVID-vaccine.  She has received flu vaccine.  History reviewed. No pertinent past medical history.  History reviewed. No pertinent  surgical history.  History reviewed. No pertinent family history.  Social History   Socioeconomic History   Marital status: Single    Spouse name: Not on file   Number of children: Not on file   Years of education: Not on file   Highest education level: Not on file  Occupational History   Not on file  Tobacco Use   Smoking status: Never   Smokeless tobacco: Never  Vaping Use   Vaping Use: Never used  Substance and Sexual Activity   Alcohol use: Not Currently   Drug use: Not Currently    Types: Marijuana   Sexual activity: Yes    Birth control/protection: None  Other Topics Concern   Not on file  Social History Narrative   Not on file   Social Determinants of Health   Financial Resource Strain: Medium Risk   Difficulty of Paying Living Expenses: Somewhat hard  Food Insecurity: No Food Insecurity   Worried About Charity fundraiser in the Last Year: Never true   Ran Out of Food in the Last Year: Never true  Transportation Needs: No Transportation Needs   Lack of Transportation (Medical): No   Lack of Transportation (Non-Medical): No  Physical Activity: Sufficiently Active   Days of Exercise per Week: 7 days   Minutes of Exercise per Session: 30 min  Stress: No Stress Concern Present   Feeling of Stress : Not at all  Social Connections: Moderately Isolated   Frequency of Communication with Friends and Family: Never  Frequency of Social Gatherings with Friends and Family: More than three times a week   Attends Religious Services: 1 to 4 times per year   Active Member of [employer] or Organizations: No   Attends Archivist Meetings: Never   Marital Status: Never married  Human resources officer Violence: Not At Risk   Fear of Current or Ex-Partner: No   Emotionally Abused: No   Physically Abused: No   Sexually Abused: No    ROS Review of Systems  Constitutional:  Negative for chills and fever.  HENT:  Negative for congestion, sinus pressure, sinus pain and sore  throat.   Eyes:  Negative for pain and discharge.  Respiratory:  Negative for cough and shortness of breath.        Left sided chest wall pain  Cardiovascular:  Negative for chest pain and palpitations.  Gastrointestinal:  Positive for blood in stool and constipation. Negative for abdominal pain, diarrhea, nausea and vomiting.  Endocrine: Negative for polydipsia and polyuria.  Genitourinary:  Negative for dysuria and hematuria.  Musculoskeletal:  Negative for neck pain and neck stiffness.  Skin:  Negative for rash.  Neurological:  Negative for dizziness and weakness.  Psychiatric/Behavioral:  Negative for agitation and behavioral problems.    Objective:   Today's Vitals: BP (!) 118/58 (BP Location: Left Arm, Patient Position: Sitting, Cuff Size: Normal)   Pulse 80   Resp 16   Ht _0  (1.6 m)   Wt 128 lb 1.3 oz (58.1 kg)   SpO2 97%   BMI 22.69 kg/m   Physical Exam Vitals reviewed.  Constitutional:      General: She is not in acute distress.    Appearance: She is not diaphoretic.  HENT:     Head: Normocephalic and atraumatic.     Nose: Nose normal.     Mouth/Throat:     Mouth: Mucous membranes are moist.  Eyes:     General: No scleral icterus.    Extraocular Movements: Extraocular movements intact.  Cardiovascular:     Rate and Rhythm: Normal rate and regular rhythm.     Pulses: Normal pulses.     Heart sounds: Normal heart sounds. No murmur heard. Pulmonary:     Breath sounds: Normal breath sounds. No wheezing or rales.  Musculoskeletal:     Cervical back: Neck supple. No tenderness.     Right lower leg: No edema.     Left lower leg: No edema.  Skin:    General: Skin is warm.     Findings: No rash.  Neurological:     General: No focal deficit present.     Mental Status: She is alert and oriented to person, place, and time.  Psychiatric:        Mood and Affect: Mood normal.        Behavior: Behavior normal.    Assessment & Plan:   Problem List Items  Addressed This Visit       Cardiovascular and Mediastinum   Hemorrhoids    Has had band ligation, has seen colorectal surgeon Advised to take senna or MiraLAX for constipation Needs to improve hydration       Relevant Orders   CBC with Differential/Platelet     Digestive   Chronic constipation - Primary    Advised to take senna or MiraLAX for constipation Needs to improve hydration      Relevant Orders   CMP14+EGFR     Genitourinary   Microscopic hematuria    Has  had urology eval, CT abdomen and US abdomen negative for renal mass, stone or hydronephrosis      Relevant Orders   Urinalysis     Other   Chest wall pain    Chest wall pain appears to be MSK in etiology Ibuprofen as needed 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  Chest pain history and chart review are not concerning for anginal pain.      Chronic pelvic pain in female    Followed by OB/GYN      Other Visit Diagnoses     Hyperlipidemia, unspecified hyperlipidemia type       Relevant Orders   Lipid Profile       Outpatient Encounter Medications as of 07/22/2021  Medication Sig   [DISCONTINUED] doxycycline (VIBRAMYCIN) 100 MG capsule Take 1 capsule (100 mg total) by mouth 2 (two) times daily. (Patient not taking: Reported on 05/17/2021)   [DISCONTINUED] hydrOXYzine (ATARAX/VISTARIL) 25 MG tablet Take 25 mg by mouth at bedtime as needed for itching. (Patient not taking: Reported on 10/01/2020)   [DISCONTINUED] metroNIDAZOLE (FLAGYL) 500 MG tablet Take 1 tablet (500 mg total) by mouth 2 (two) times daily. (Patient not taking: Reported on 05/17/2021)   [DISCONTINUED] sertraline (ZOLOFT) 25 MG tablet Take 25 mg by mouth daily. Take 1 tablet (61m total) by mouth daily for 14 days, then take 2 tablets (552mtotal) daily   No facility-administered encounter medications on file as of 07/22/2021.    Follow-up: Return in about 6 months (around 01/19/2022).    RuLindell SparMD

## 2021-07-22 NOTE — Assessment & Plan Note (Signed)
Advised to take senna or MiraLAX for constipation Needs to improve hydration 

## 2021-07-23 ENCOUNTER — Other Ambulatory Visit: Payer: Self-pay | Admitting: *Deleted

## 2021-07-23 DIAGNOSIS — Z1159 Encounter for screening for other viral diseases: Secondary | ICD-10-CM

## 2021-11-13 ENCOUNTER — Ambulatory Visit: Payer: No Typology Code available for payment source | Admitting: Family Medicine

## 2021-11-21 ENCOUNTER — Other Ambulatory Visit: Payer: Self-pay

## 2021-11-21 ENCOUNTER — Ambulatory Visit
Admission: EM | Admit: 2021-11-21 | Discharge: 2021-11-21 | Disposition: A | Discharge status: Home / Self Care | Payer: No Typology Code available for payment source | Attending: Urgent Care | Admitting: Urgent Care

## 2021-11-21 DIAGNOSIS — Z7251 High risk heterosexual behavior: Secondary | ICD-10-CM | POA: Diagnosis present

## 2021-11-21 DIAGNOSIS — N898 Other specified noninflammatory disorders of vagina: Secondary | ICD-10-CM | POA: Insufficient documentation

## 2021-11-21 DIAGNOSIS — R102 Pelvic and perineal pain: Secondary | ICD-10-CM | POA: Diagnosis present

## 2021-11-21 DIAGNOSIS — E860 Dehydration: Secondary | ICD-10-CM | POA: Insufficient documentation

## 2021-11-21 HISTORY — DX: Depression, unspecified: F32.A

## 2021-11-21 HISTORY — DX: Anxiety disorder, unspecified: F41.9

## 2021-11-21 LAB — POCT URINALYSIS DIP (MANUAL ENTRY)
Bilirubin, UA: NEGATIVE
Glucose, UA: NEGATIVE mg/dL
Leukocytes, UA: NEGATIVE
Nitrite, UA: NEGATIVE
Protein Ur, POC: NEGATIVE mg/dL
Spec Grav, UA: >=1.03 — AB (ref 1.010–1.025)
Urobilinogen, UA: 0.2 E.U./dL
pH, UA: 5.5 (ref 5.0–8.0)

## 2021-11-21 LAB — POCT URINE PREGNANCY: Preg Test, Ur: NEGATIVE

## 2021-11-21 MED ORDER — NAPROXEN 500 MG PO TABS: 500.0000 mg | ORAL_TABLET | Freq: Two times a day (BID) | ORAL | 0 refills | Status: DC

## 2021-11-21 NOTE — ED Provider Notes (Signed)
?[facility] ? ? ?MRN: [id number] DOB: [birth year] ? ?Subjective:  ? ?Karen Sharp is a 31 y.o. female presenting for 2 week history of dysuria, blood in the stool. Has had nausea without vomiting, pelvic pain, vaginal discharge. Patient is sexually active, does not use condoms for protection, has 1 female partner. LMP was beginning of February 2023. Usually has regular cycles.  Does not hydrate with water at all.  Drinks juice and ginger ale for the most part.  No coffee or alcohol. ? ?No current facility-administered medications for this encounter. ?No current outpatient medications on file.  ? ?No Known Allergies ? ?Past Medical History:  ?Diagnosis Date  ? Anxiety   ? Depression   ?  ? ?History reviewed. No pertinent surgical history. ? ?History reviewed. No pertinent family history. ? ?Social History  ? ?Tobacco Use  ? Smoking status: Never  ? Smokeless tobacco: Never  ?Substance Use Topics  ? Alcohol use: Yes  ? Drug use: Never  ? ? ?ROS ? ? ?Objective:  ? ?Vitals: ?BP 112/67 (BP Location: Right Arm)   Pulse 97   Temp 98 ?F (36.7 ?C) (Oral)   Resp 16   LMP 10/03/2021 (Exact Date)   SpO2 96%  ? ?Physical Exam ?Constitutional:   ?   General: She is not in acute distress. ?   Appearance: Normal appearance. She is well-developed. She is not ill-appearing, toxic-appearing or diaphoretic.  ?HENT:  ?   Head: Normocephalic and atraumatic.  ?   Nose: Nose normal.  ?   Mouth/Throat:  ?   Mouth: Mucous membranes are moist.  ?Eyes:  ?   General: No scleral icterus.    ?   Right eye: No discharge.     ?   Left eye: No discharge.  ?   Extraocular Movements: Extraocular movements intact.  ?   Conjunctiva/sclera: Conjunctivae normal.  ?Cardiovascular:  ?   Rate and Rhythm: Normal rate.  ?Pulmonary:  ?   Effort: Pulmonary effort is normal.  ?Abdominal:  ?   General: Bowel sounds are normal. There is no distension.  ?   Palpations: Abdomen is soft. There is no mass.  ?   Tenderness: There is no abdominal  tenderness. There is no right CVA tenderness, left CVA tenderness, guarding or rebound.  ?Skin: ?   General: Skin is warm and dry.  ?Neurological:  ?   General: No focal deficit present.  ?   Mental Status: She is alert and oriented to person, place, and time.  ?Psychiatric:     ?   Mood and Affect: Mood normal.     ?   Behavior: Behavior normal.     ?   Thought Content: Thought content normal.     ?   Judgment: Judgment normal.  ? ? ?Results for orders placed or performed during the hospital encounter of 11/21/21 (from the past 24 hour(s))  ?POCT urinalysis dipstick     Status: Abnormal  ? Collection Time: 11/21/21  2:28 PM  ?Result Value Ref Range  ? Color, UA yellow yellow  ? Clarity, UA cloudy (A) clear  ? Glucose, UA negative negative mg/dL  ? Bilirubin, UA negative negative  ? Ketones, POC UA trace (5) (A) negative mg/dL  ? Spec Grav, UA >=1.030 (A) 1.010 - 1.025  ? Blood, UA large (A) negative  ? pH, UA 5.5 5.0 - 8.0  ? Protein Ur, POC negative negative mg/dL  ? Urobilinogen, UA 0.2 0.2 or 1.0 E.U./dL  ?  Nitrite, UA Negative Negative  ? Leukocytes, UA Negative Negative  ?POCT urine pregnancy     Status: None  ? Collection Time: 11/21/21  2:30 PM  ?Result Value Ref Range  ? Preg Test, Ur Negative Negative  ? ? ?Assessment and Plan :  ? ?PDMP not reviewed this encounter. ? ?1. Pelvic pain in female   ?2. Dehydration   ?3. Unprotected sex   ?4. Pelvic pain   ?5. Vaginal discharge   ? ?Emphasized need to hydrate much better.  Use naproxen for supportive care, pain and inflammation.  Recommended waiting for results to treat based off of that for the cervical swab and RPR, HIV test.  Urine culture is pending as well.  Recheck with her gynecologist and/or PCP for general health check as patient requested general blood work.  Low suspicion for PID.  Counseled patient on potential for adverse effects with medications prescribed/recommended today, ER and return-to-clinic precautions discussed, patient verbalized  understanding. ? ?  ?Wallis Bamberg, PA-C ?11/21/21 1446 ? ?

## 2021-11-21 NOTE — ED Triage Notes (Signed)
Pt reports x 1 year; pt reports light red blood in stool sand toilet; fatigue x 3-4 weeks . States she has hemorrhoids.  ? ?Pt requested STD's test.  ? ? ?

## 2021-11-21 NOTE — Discharge Instructions (Addendum)

## 2021-11-22 ENCOUNTER — Telehealth (HOSPITAL_COMMUNITY): Payer: Self-pay | Admitting: Emergency Medicine

## 2021-11-22 ENCOUNTER — Encounter: Payer: Self-pay | Admitting: Internal Medicine

## 2021-11-22 LAB — CERVICOVAGINAL ANCILLARY ONLY
Bacterial Vaginitis (gardnerella): POSITIVE — AB
Candida Glabrata: NEGATIVE
Candida Vaginitis: POSITIVE — AB
Chlamydia: NEGATIVE
Comment: NEGATIVE
Comment: NEGATIVE
Comment: NEGATIVE
Comment: NEGATIVE
Comment: NEGATIVE
Comment: NORMAL
Neisseria Gonorrhea: NEGATIVE
Trichomonas: NEGATIVE

## 2021-11-22 LAB — RPR: RPR Ser Ql: NONREACTIVE

## 2021-11-22 LAB — HIV ANTIBODY (ROUTINE TESTING W REFLEX): HIV Screen 4th Generation wRfx: NONREACTIVE

## 2021-11-22 MED ORDER — METRONIDAZOLE 0.75 % VA GEL: 1.0000 | Freq: Every day | VAGINAL | 0 refills | Status: AC

## 2021-11-22 MED ORDER — FLUCONAZOLE 150 MG PO TABS: 150.0000 mg | ORAL_TABLET | Freq: Once | ORAL | 0 refills | Status: AC

## 2021-11-23 LAB — URINE CULTURE: Culture: NO GROWTH

## 2022-01-20 ENCOUNTER — Ambulatory Visit: Payer: No Typology Code available for payment source | Admitting: Internal Medicine

## 2022-02-19 ENCOUNTER — Other Ambulatory Visit: Payer: Self-pay | Admitting: Nurse Practitioner

## 2022-02-19 DIAGNOSIS — N644 Mastodynia: Secondary | ICD-10-CM

## 2022-02-20 ENCOUNTER — Encounter: Payer: Self-pay | Admitting: Internal Medicine

## 2022-02-20 ENCOUNTER — Ambulatory Visit (INDEPENDENT_AMBULATORY_CARE_PROVIDER_SITE_OTHER): Payer: No Typology Code available for payment source | Admitting: Internal Medicine

## 2022-02-20 VITALS — BP 102/62 | HR 96 | Resp 18 | Ht 63.0 in | Wt 139.8 lb

## 2022-02-20 DIAGNOSIS — B370 Candidal stomatitis: Secondary | ICD-10-CM

## 2022-02-20 DIAGNOSIS — K5909 Other constipation: Secondary | ICD-10-CM

## 2022-02-20 DIAGNOSIS — K649 Unspecified hemorrhoids: Secondary | ICD-10-CM

## 2022-02-20 DIAGNOSIS — Z1159 Encounter for screening for other viral diseases: Secondary | ICD-10-CM | POA: Diagnosis not present

## 2022-02-20 DIAGNOSIS — R3129 Other microscopic hematuria: Secondary | ICD-10-CM

## 2022-02-20 DIAGNOSIS — E782 Mixed hyperlipidemia: Secondary | ICD-10-CM | POA: Diagnosis not present

## 2022-02-20 MED ORDER — NYSTATIN 100000 UNIT/ML MT SUSP: 5.0000 mL | Freq: Four times a day (QID) | OROMUCOSAL | 0 refills | Status: DC

## 2022-02-20 MED ORDER — HYDROCORTISONE (PERIANAL) 2.5 % EX CREA: 1.0000 | TOPICAL_CREAM | Freq: Two times a day (BID) | CUTANEOUS | 0 refills | Status: DC

## 2022-02-20 NOTE — Assessment & Plan Note (Addendum)
Nystatin swish and spit for oral thrush Needs to take folic acid and multivitamin to avoid nutritional deficiency

## 2022-02-20 NOTE — Assessment & Plan Note (Signed)
Has had urology eval, CT abdomen and US abdomen negative for renal mass, stone or hydronephrosis Recent episode of questionable hematuria, could be related to blood mixed with urine -bleeding could be due to menstrual cycle or recent sexual activity -had recent UA at Ob/Gyn office

## 2022-02-20 NOTE — Patient Instructions (Addendum)
Please use Anusol cream for rectal irritation.  Please continue to take Miralax to avoid constipation and maintain at least 64 ounces of fluid intake in a day.  Please take Dietrich Pates' health multivitamin once daily.

## 2022-02-20 NOTE — Assessment & Plan Note (Signed)
Anusol cream as needed Has had band ligation, has seen colorectal surgeon Advised to take senna or MiraLAX for constipation Needs to improve hydration 

## 2022-02-20 NOTE — Progress Notes (Signed)
Established Patient Office Visit  Subjective:  Patient ID: Karen Sharp, female    DOB: [birth year]  Age: 31 y.o. MRN: [id number]  CC:  Chief Complaint  Patient presents with   Follow-up    6 month follow up pt is having hot flashes did see obgyn and got blood work there she did not get labs that were ordered here can have drawn today if needed also hemorrhoids have came back     HPI Karen Sharp is a 31 y.o. female with past medical history of chronic constipation, hemorrhoids, microscopic hematuria and chronic pelvic pain who presents for f/u of her chronic medical conditions.  She complains of rectal bleeding, which is intermittent.  She has had colonoscopy in 04/2021, which showed internal hemorrhoids and 1 hyperplastic polyp, which was removed.  She also has seen general surgeon, and had banding of the hemorrhoids.  She denies any melena, nausea or vomiting currently.  She has taken senna and MiraLAX for constipation, which have helped.  She also admits that she needs to improve her hydration.  She also reports hematuria, but states that she also recently started having menstrual cycle.  She also reports that she had sexual intercourse around the time when she had hematuria, and is not sure if it was related to it. She went to Ob/Gyn and had urine test done.  She also complained of hot flashes, and had TSH, FSH and LH testing at Ob/Gyn office.  She also reports whitish patch over her tongue, which she can not scrap off.    Past Medical History:  Diagnosis Date   Anxiety    Depression     History reviewed. No pertinent surgical history.  History reviewed. No pertinent family history.  Social History   Socioeconomic History   Marital status: Single    Spouse name: Not on file   Number of children: Not on file   Years of education: Not on file   Highest education level: Not on file  Occupational History   Not on file  Tobacco Use   Smoking status: Never    Smokeless tobacco: Never  Vaping Use   Vaping Use: Never used  Substance and Sexual Activity   Alcohol use: Yes   Drug use: Never    Types: Marijuana   Sexual activity: Yes    Birth control/protection: None  Other Topics Concern   Not on file  Social History Narrative   ** Merged History Encounter **       Social Determinants of Health   Financial Resource Strain: Medium Risk (10/01/2020)   Overall Financial Resource Strain (CARDIA)    Difficulty of Paying Living Expenses: Somewhat hard  Food Insecurity: No Food Insecurity (10/01/2020)   Hunger Vital Sign    Worried About Running Out of Food in the Last Year: Never true    Ran Out of Food in the Last Year: Never true  Transportation Needs: No Transportation Needs (10/01/2020)   PRAPARE - Hydrologist (Medical): No    Lack of Transportation (Non-Medical): No  Physical Activity: Sufficiently Active (10/01/2020)   Exercise Vital Sign    Days of Exercise per Week: 7 days    Minutes of Exercise per Session: 30 min  Stress: No Stress Concern Present (10/01/2020)   [place]    Feeling of Stress : Not at all  Social Connections: Moderately Isolated (10/01/2020)   Social Connection and Isolation  Panel [NHANES]    Frequency of Communication with Friends and Family: Never    Frequency of Social Gatherings with Friends and Family: More than three times a week    Attends Religious Services: 1 to 4 times per year    Active Member of [employer] or Organizations: No    Attends Archivist Meetings: Never    Marital Status: Never married  Intimate Partner Violence: Not At Risk (10/01/2020)   Humiliation, Afraid, Rape, and Kick questionnaire    Fear of Current or Ex-Partner: No    Emotionally Abused: No    Physically Abused: No    Sexually Abused: No    Outpatient Medications Prior to Visit  Medication Sig Dispense Refill   naproxen  (NAPROSYN) 500 MG tablet Take 1 tablet (500 mg total) by mouth 2 (two) times daily with a meal. (Patient not taking: Reported on 02/20/2022) 30 tablet 0   No facility-administered medications prior to visit.    Allergies  Allergen Reactions   Dog Epithelium Allergy Skin Test Hives and Rash    ROS Review of Systems  Constitutional:  Negative for chills and fever.  HENT:  Negative for congestion, sinus pressure, sinus pain and sore throat.   Eyes:  Negative for pain and discharge.  Respiratory:  Negative for cough and shortness of breath.   Cardiovascular:  Negative for chest pain and palpitations.  Gastrointestinal:  Positive for blood in stool and constipation. Negative for abdominal pain, diarrhea, nausea and vomiting.  Endocrine: Negative for polydipsia and polyuria.  Genitourinary:  Negative for dysuria and frequency.  Musculoskeletal:  Negative for neck pain and neck stiffness.  Skin:  Negative for rash.  Neurological:  Negative for dizziness and weakness.  Psychiatric/Behavioral:  Negative for agitation and behavioral problems.       Objective:    Physical Exam Vitals reviewed.  Constitutional:      General: She is not in acute distress.    Appearance: She is not diaphoretic.  HENT:     Head: Normocephalic and atraumatic.     Nose: Nose normal.     Mouth/Throat:     Mouth: Mucous membranes are moist.     Comments: Whitish patch over tongue Eyes:     General: No scleral icterus.    Extraocular Movements: Extraocular movements intact.  Cardiovascular:     Rate and Rhythm: Normal rate and regular rhythm.     Pulses: Normal pulses.     Heart sounds: Normal heart sounds. No murmur heard. Pulmonary:     Breath sounds: Normal breath sounds. No wheezing or rales.  Musculoskeletal:     Cervical back: Neck supple. No tenderness.     Right lower leg: No edema.     Left lower leg: No edema.  Skin:    General: Skin is warm.     Findings: No rash.  Neurological:      General: No focal deficit present.     Mental Status: She is alert and oriented to person, place, and time.  Psychiatric:        Mood and Affect: Mood normal.        Behavior: Behavior normal.     BP 102/62 (BP Location: Right Arm, Patient Position: Sitting, Cuff Size: Normal)   Pulse 96   Resp 18   Ht $R'5\' 3"'wl$  (1.6 m)   Wt 139 lb 12.8 oz (63.4 kg)   SpO2 98%   BMI 24.76 kg/m  Wt Readings from Last 3 Encounters:  02/20/22  139 lb 12.8 oz (63.4 kg)  07/22/21 128 lb 1.3 oz (58.1 kg)  05/17/21 123 lb 6.4 oz (56 kg)    No results found for: "TSH" Lab Results  Component Value Date   WBC 7.7 03/27/2020   HGB 11.9 (L) 03/27/2020   HCT 35.6 (L) 03/27/2020   MCV 91.3 03/27/2020   PLT 192 03/27/2020   Lab Results  Component Value Date   NA 139 03/27/2020   K 3.9 03/27/2020   CO2 27 03/27/2020   GLUCOSE 89 03/27/2020   BUN 11 03/27/2020   CREATININE 0.62 03/27/2020   BILITOT 0.5 03/27/2020   ALKPHOS 41 03/27/2020   AST 22 03/27/2020   ALT 16 03/27/2020   PROT 7.0 03/27/2020   ALBUMIN 3.9 03/27/2020   CALCIUM 9.3 03/27/2020   ANIONGAP 7 03/27/2020   No results found for: "CHOL" No results found for: "HDL" No results found for: "LDLCALC" No results found for: "TRIG" No results found for: "CHOLHDL" No results found for: "HGBA1C"    Assessment & Plan:   Problem List Items Addressed This Visit       Cardiovascular and Mediastinum   Hemorrhoids - Primary    Anusol cream as needed Has had band ligation, has seen colorectal surgeon Advised to take senna or MiraLAX for constipation Needs to improve hydration      Relevant Medications   hydrocortisone (ANUSOL-HC) 2.5 % rectal cream   Other Relevant Orders   CBC with Differential/Platelet     Digestive   Chronic constipation    Advised to take senna or MiraLAX for constipation Needs to improve hydration      Oral thrush    Nystatin swish and spit for oral thrush Needs to take folic acid and multivitamin to  avoid nutritional deficiency      Relevant Medications   nystatin (MYCOSTATIN) 100000 UNIT/ML suspension     Genitourinary   Microscopic hematuria    Has had urology eval, CT abdomen and US abdomen negative for renal mass, stone or hydronephrosis Recent episode of questionable hematuria, could be related to blood mixed with urine -bleeding could be due to menstrual cycle or recent sexual activity -had recent UA at Ob/Gyn office      Other Visit Diagnoses     Need for hepatitis C screening test       Relevant Orders   Hepatitis C Antibody   Mixed hyperlipidemia       Relevant Orders   Lipid panel   CMP14+EGFR       Meds ordered this encounter  Medications   hydrocortisone (ANUSOL-HC) 2.5 % rectal cream    Sig: Place 1 Application rectally 2 (two) times daily.    Dispense:  30 g    Refill:  0   nystatin (MYCOSTATIN) 100000 UNIT/ML suspension    Sig: Take 5 mLs (500,000 Units total) by mouth 4 (four) times daily.    Dispense:  60 mL    Refill:  0    Follow-up: Return in about 6 months (around 08/22/2022) for Annual physical.    Lindell Spar, MD

## 2022-02-20 NOTE — Assessment & Plan Note (Signed)
Advised to take senna or MiraLAX for constipation Needs to improve hydration

## 2022-02-21 LAB — CMP14+EGFR
ALT: 13 IU/L (ref 0–32)
AST: 22 IU/L (ref 0–40)
Albumin/Globulin Ratio: 1.6 (ref 1.2–2.2)
Albumin: 4.2 g/dL (ref 3.9–5.0)
Alkaline Phosphatase: 68 IU/L (ref 44–121)
BUN/Creatinine Ratio: 13 (ref 9–23)
BUN: 9 mg/dL (ref 6–20)
Bilirubin Total: 0.3 mg/dL (ref 0.0–1.2)
CO2: 26 mmol/L (ref 20–29)
Calcium: 9.4 mg/dL (ref 8.7–10.2)
Chloride: 106 mmol/L (ref 96–106)
Creatinine, Ser: 0.67 mg/dL (ref 0.57–1.00)
Globulin, Total: 2.6 g/dL (ref 1.5–4.5)
Glucose: 97 mg/dL (ref 70–99)
Potassium: 3.7 mmol/L (ref 3.5–5.2)
Sodium: 144 mmol/L (ref 134–144)
Total Protein: 6.8 g/dL (ref 6.0–8.5)
eGFR: 122 mL/min/{1.73_m2} (ref >59)

## 2022-02-21 LAB — LIPID PANEL
Chol/HDL Ratio: 3.1 ratio (ref 0.0–4.4)
Cholesterol, Total: 161 mg/dL (ref 100–199)
HDL: 52 mg/dL (ref >39)
LDL Chol Calc (NIH): 86 mg/dL (ref 0–99)
Triglycerides: 131 mg/dL (ref 0–149)
VLDL Cholesterol Cal: 23 mg/dL (ref 5–40)

## 2022-02-21 LAB — CBC WITH DIFFERENTIAL/PLATELET
Basophils Absolute: 0 10*3/uL (ref 0.0–0.2)
Basos: 0 %
EOS (ABSOLUTE): 0.1 10*3/uL (ref 0.0–0.4)
Eos: 2 %
Hematocrit: 37.1 % (ref 34.0–46.6)
Hemoglobin: 12.7 g/dL (ref 11.1–15.9)
Immature Grans (Abs): 0 10*3/uL (ref 0.0–0.1)
Immature Granulocytes: 0 %
Lymphocytes Absolute: 1.7 10*3/uL (ref 0.7–3.1)
Lymphs: 24 %
MCH: 30.2 pg (ref 26.6–33.0)
MCHC: 34.2 g/dL (ref 31.5–35.7)
MCV: 88 fL (ref 79–97)
Monocytes Absolute: 0.4 10*3/uL (ref 0.1–0.9)
Monocytes: 6 %
Neutrophils Absolute: 4.9 10*3/uL (ref 1.4–7.0)
Neutrophils: 68 %
Platelets: 236 10*3/uL (ref 150–450)
RBC: 4.21 x10E6/uL (ref 3.77–5.28)
RDW: 12.4 % (ref 11.7–15.4)
WBC: 7.1 10*3/uL (ref 3.4–10.8)

## 2022-02-21 LAB — HEPATITIS C ANTIBODY: Hep C Virus Ab: NONREACTIVE

## 2022-03-10 ENCOUNTER — Ambulatory Visit: Payer: No Typology Code available for payment source | Admitting: Internal Medicine

## 2022-03-11 ENCOUNTER — Ambulatory Visit
Admission: RE | Admit: 2022-03-11 | Discharge: 2022-03-11 | Disposition: A | Discharge status: Home / Self Care | Payer: No Typology Code available for payment source | Source: Ambulatory Visit | Attending: Nurse Practitioner | Admitting: Nurse Practitioner

## 2022-03-11 DIAGNOSIS — N644 Mastodynia: Secondary | ICD-10-CM

## 2022-04-18 ENCOUNTER — Encounter: Payer: Self-pay | Admitting: Internal Medicine

## 2022-04-21 NOTE — Telephone Encounter (Signed)
Pt appt scheduled.  Patient called office

## 2022-04-22 ENCOUNTER — Ambulatory Visit (INDEPENDENT_AMBULATORY_CARE_PROVIDER_SITE_OTHER): Payer: No Typology Code available for payment source | Admitting: Internal Medicine

## 2022-04-22 ENCOUNTER — Encounter: Payer: Self-pay | Admitting: Internal Medicine

## 2022-04-22 DIAGNOSIS — B354 Tinea corporis: Secondary | ICD-10-CM | POA: Diagnosis not present

## 2022-04-22 DIAGNOSIS — B370 Candidal stomatitis: Secondary | ICD-10-CM | POA: Diagnosis not present

## 2022-04-22 MED ORDER — NYSTATIN 100000 UNIT/ML MT SUSP: 5.0000 mL | Freq: Four times a day (QID) | OROMUCOSAL | 0 refills | Status: DC

## 2022-04-22 MED ORDER — ITRACONAZOLE 100 MG PO CAPS: 100.0000 mg | ORAL_CAPSULE | Freq: Every day | ORAL | 0 refills | Status: DC

## 2022-04-22 NOTE — Progress Notes (Signed)
Virtual Visit via Telephone Note   This visit type was conducted due to national recommendations for restrictions regarding the COVID-19 Pandemic (e.g. social distancing) in an effort to limit this patient's exposure and mitigate transmission in our community.  Due to her co-morbid illnesses, this patient is at least at moderate risk for complications without adequate follow up.  This format is felt to be most appropriate for this patient at this time.  The patient did not have access to video technology/had technical difficulties with video requiring transitioning to audio format only (telephone).  All issues noted in this document were discussed and addressed.  No physical exam could be performed with this format.  Evaluation Performed:  Follow-up visit  Date:  04/22/2022   ID:  Karen Sharp, DOB 1991-05-06, MRN [id number]  Patient Location: Home Provider Location: Office/Clinic  Participants: Patient Location of Patient: Home Location of Provider: Telehealth Consent was obtain for visit to be over via telehealth. I verified that I am speaking with the correct person using two identifiers.  PCP:  Anabel Halon, MD   Chief Complaint: Rash over trunk  History of Present Illness:    Karen Sharp is a 31 y.o. female who has a televisit for c/o rash over her chest wall, abdominal wall and back for the last 3 weeks.  She states that it is brownish in color, circular and intensely itching.  She has tried applying clotrimazole cream with some relief.  Denies any recent injury, insect bite or new chemical exposure.  She also reports having oral thrush, for which she was given nystatin suspension in the past.  She requests a refill of it.  The patient does not have symptoms concerning for COVID-19 infection (fever, chills, cough, or new shortness of breath).   Past Medical, Surgical, Social History, Allergies, and Medications have been Reviewed.  Past Medical History:   Diagnosis Date   Anxiety    Depression    No past surgical history on file.   Current Meds  Medication Sig   hydrocortisone (ANUSOL-HC) 2.5 % rectal cream Place 1 Application rectally 2 (two) times daily.   itraconazole (SPORANOX) 100 MG capsule Take 1 capsule (100 mg total) by mouth daily.   [DISCONTINUED] nystatin (MYCOSTATIN) 100000 UNIT/ML suspension Take 5 mLs (500,000 Units total) by mouth 4 (four) times daily.     Allergies:   Dog epithelium allergy skin test   ROS:   Please see the history of present illness.     All other systems reviewed and are negative.   Labs/Other Tests and Data Reviewed:    Recent Labs: 02/20/2022: ALT 13; BUN 9; Creatinine, Ser 0.67; Hemoglobin 12.7; Platelets 236; Potassium 3.7; Sodium 144   Recent Lipid Panel Lab Results  Component Value Date/Time   CHOL 161 02/20/2022 10:38 AM   TRIG 131 02/20/2022 10:38 AM   HDL 52 02/20/2022 10:38 AM   CHOLHDL 3.1 02/20/2022 10:38 AM   LDLCALC 86 02/20/2022 10:38 AM    Wt Readings from Last 3 Encounters:  02/20/22 139 lb 12.8 oz (63.4 kg)  07/22/21 128 lb 1.3 oz (58.1 kg)  05/17/21 123 lb 6.4 oz (56 kg)     ASSESSMENT & PLAN:    Tinea corporis Her rash over trunk area likely tinea corporis Started itraconazole 100 mg daily for 2 weeks Continue clotrimazole cream  Oral thrush Nystatin swish and spit for oral thrush Needs to take folic acid and multivitamin to avoid nutritional deficiency  Time:   Today, I have spent 11 minutes reviewing the chart, including problem list, medications, and with the patient with telehealth technology discussing the above problems.   Medication Adjustments/Labs and Tests Ordered: Current medicines are reviewed at length with the patient today.  Concerns regarding medicines are outlined above.   Tests Ordered: No orders of the defined types were placed in this encounter.   Medication Changes: Meds ordered this encounter  Medications   itraconazole  (SPORANOX) 100 MG capsule    Sig: Take 1 capsule (100 mg total) by mouth daily.    Dispense:  14 capsule    Refill:  0   nystatin (MYCOSTATIN) 100000 UNIT/ML suspension    Sig: Take 5 mLs (500,000 Units total) by mouth 4 (four) times daily.    Dispense:  60 mL    Refill:  0     Note: This dictation was prepared with Dragon dictation along with smaller phrase technology. Similar sounding words can be transcribed inadequately or may not be corrected upon review. Any transcriptional errors that result from this process are unintentional.      Disposition:  Follow up  Signed, Anabel Halon, MD  04/22/2022 5:37 PM     [facility]

## 2022-04-22 NOTE — Assessment & Plan Note (Signed)
Nystatin swish and spit for oral thrush Needs to take folic acid and multivitamin to avoid nutritional deficiency

## 2022-04-22 NOTE — Assessment & Plan Note (Signed)
Her rash over trunk area likely tinea corporis Started itraconazole 100 mg daily for 2 weeks Continue clotrimazole cream

## 2022-06-11 ENCOUNTER — Ambulatory Visit
Admission: EM | Admit: 2022-06-11 | Discharge: 2022-06-11 | Disposition: A | Discharge status: Home / Self Care | Payer: No Typology Code available for payment source | Attending: Family Medicine | Admitting: Family Medicine

## 2022-06-11 DIAGNOSIS — R0789 Other chest pain: Secondary | ICD-10-CM | POA: Insufficient documentation

## 2022-06-11 DIAGNOSIS — R52 Pain, unspecified: Secondary | ICD-10-CM | POA: Diagnosis present

## 2022-06-11 DIAGNOSIS — Z1152 Encounter for screening for COVID-19: Secondary | ICD-10-CM | POA: Diagnosis not present

## 2022-06-11 DIAGNOSIS — J069 Acute upper respiratory infection, unspecified: Secondary | ICD-10-CM | POA: Diagnosis present

## 2022-06-11 DIAGNOSIS — M545 Low back pain, unspecified: Secondary | ICD-10-CM

## 2022-06-11 DIAGNOSIS — Z3202 Encounter for pregnancy test, result negative: Secondary | ICD-10-CM | POA: Diagnosis not present

## 2022-06-11 LAB — POCT URINALYSIS DIP (MANUAL ENTRY)
Bilirubin, UA: NEGATIVE
Glucose, UA: NEGATIVE mg/dL
Ketones, POC UA: NEGATIVE mg/dL
Leukocytes, UA: NEGATIVE
Nitrite, UA: NEGATIVE
Protein Ur, POC: 100 mg/dL — AB
Spec Grav, UA: >=1.03 — AB (ref 1.010–1.025)
Urobilinogen, UA: 0.2 E.U./dL
pH, UA: 6 (ref 5.0–8.0)

## 2022-06-11 LAB — RESP PANEL BY RT-PCR (FLU A&B, COVID) ARPGX2
Influenza A by PCR: NEGATIVE
Influenza B by PCR: NEGATIVE
SARS Coronavirus 2 by RT PCR: NEGATIVE

## 2022-06-11 LAB — POCT URINE PREGNANCY: Preg Test, Ur: NEGATIVE

## 2022-06-11 MED ORDER — ALBUTEROL SULFATE HFA 108 (90 BASE) MCG/ACT IN AERS: 1.0000 | INHALATION_SPRAY | Freq: Four times a day (QID) | RESPIRATORY_TRACT | 0 refills | Status: DC | PRN

## 2022-06-11 NOTE — ED Provider Notes (Signed)
[facility]    CSN: [id number] Arrival date & time: 06/11/22  1607      History   Chief Complaint Chief Complaint  Patient presents with   Headache   Chills        Back Pain   Shortness of Breath         HPI Karen Sharp is a 31 y.o. female.   Patient presenting today with 1 day history of headache, chills, backaches and neck aches and some chest tightness, slight shortness of breath at times that she is noticed off and on for about a week but worse since symptoms started up today.  Unsure if she has had a fever, denies significant cough, sore throat, abdominal pain, nausea vomiting or diarrhea.  Does states she had very minimal appetite since symptoms started.  Took some ibuprofen with mild temporary relief of symptoms.  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  No known pertinent chronic medical problems per patient.    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 No pertinent surgical history.  OB History   No obstetric history on file.      Home Medications    Prior to Admission medications   Medication Sig Start Date End Date Taking? Authorizing Provider  albuterol (VENTOLIN HFA) 108 (90 Base) MCG/ACT inhaler Inhale 1-2 puffs into the lungs every 6 (six) hours as needed for wheezing or shortness of breath. 06/11/22  Yes Volney American, PA-C  ibuprofen  (ADVIL) 200 MG tablet Take 200 mg by mouth every 6 (six) hours as needed.   Yes [provider]  hydrocortisone (ANUSOL-HC) 2.5 % rectal cream Place 1 Application rectally 2 (two) times daily. 02/20/22   Lindell Spar, MD  itraconazole (SPORANOX) 100 MG capsule Take 1 capsule (100 mg total) by mouth daily. 04/22/22   Lindell Spar, MD  nystatin (MYCOSTATIN) 100000 UNIT/ML suspension Take 5 mLs (500,000 Units total) by mouth 4 (four) times daily. 04/22/22   Lindell Spar, MD    Family History History reviewed. No pertinent family history.  Social History Social History   Tobacco Use   Smoking status: Never   Smokeless tobacco: Never  Vaping Use   Vaping Use: Never used  Substance Use Topics   Alcohol use: Yes   Drug use: Never    Types: Marijuana     Allergies   Dog epithelium allergy skin test   Review of Systems Review of Systems Per HPI  Physical Exam Triage Vital Signs ED Triage Vitals  Enc Vitals Group     BP 06/11/22 1740 97/64     Pulse --      Resp 06/11/22 1740 18     Temp 06/11/22 1740 98.8 F (37.1 C)     Temp Source 06/11/22 1740 Oral     SpO2 06/11/22 1740 97 %  Weight --      Height --      Head Circumference --      Peak Flow --      Pain Score 06/11/22 1743 6     Pain Loc --      Pain Edu? --      Excl. in GC? --    No data found.  Updated Vital Signs BP 97/64 (BP Location: Right Arm)   Temp 98.8 F (37.1 C) (Oral)   Resp 18   LMP  (Within Months) Comment: 2 months  SpO2 97%   Visual Acuity Right Eye Distance:   Left Eye Distance:   Bilateral Distance:    Right Eye Near:   Left Eye Near:    Bilateral Near:     Physical Exam Vitals and nursing note reviewed.  Constitutional:      Appearance: Normal appearance.  HENT:     Head: Atraumatic.     Right Ear: Tympanic membrane and external ear normal.     Left Ear: Tympanic membrane and external ear normal.     Nose: Rhinorrhea present.     Mouth/Throat:      Mouth: Mucous membranes are moist.     Pharynx: No posterior oropharyngeal erythema.  Eyes:     Extraocular Movements: Extraocular movements intact.     Conjunctiva/sclera: Conjunctivae normal.  Cardiovascular:     Rate and Rhythm: Normal rate and regular rhythm.     Heart sounds: Normal heart sounds.  Pulmonary:     Effort: Pulmonary effort is normal.     Breath sounds: Normal breath sounds. No wheezing or rales.  Abdominal:     General: Bowel sounds are normal. There is no distension.     Palpations: Abdomen is soft.     Tenderness: There is no abdominal tenderness. There is no right CVA tenderness, left CVA tenderness or guarding.  Musculoskeletal:        General: No swelling or tenderness. Normal range of motion.     Cervical back: Normal range of motion and neck supple.  Skin:    General: Skin is warm and dry.  Neurological:     Mental Status: She is alert and oriented to person, place, and time.     Motor: No weakness.     Gait: Gait normal.  Psychiatric:        Mood and Affect: Mood normal.        Thought Content: Thought content normal.      UC Treatments / Results  Labs (all labs ordered are listed, but only abnormal results are displayed) Labs Reviewed  POCT URINALYSIS DIP (MANUAL ENTRY) - Abnormal; Notable for the following components:      Result Value   Spec Grav, UA >=1.030 (*)    Blood, UA moderate (*)    Protein Ur, POC =100 (*)    All other components within normal limits  RESP PANEL BY RT-PCR (FLU A&B, COVID) ARPGX2  POCT URINE PREGNANCY    EKG   Radiology No results found.  Procedures Procedures (including critical care time)  Medications Ordered in UC Medications - No data to display  Initial Impression / Assessment and Plan / UC Course  I have reviewed the triage vital signs and the nursing notes.  Pertinent labs & imaging results that were available during my care of the patient were reviewed by me and considered in my medical decision  making (see chart for details).     Vital signs benign  and reassuring, patient requested urinalysis due to her low back pain and this was negative for urinary tract infection and showing some possible mild dehydration.  Pregnancy also negative.  Discussed increase fluid intake, continue over-the-counter pain relievers as needed.  Suspect she is becoming sick with a viral infection.  Respiratory panel pending, albuterol inhaler given to see if this helps with her chest tightness symptoms.  Oxygen saturation 97% on room air and lungs clear to auscultation bilaterally today so reassurance given with this.  Return for any worsening symptoms.  Work note given.  Final Clinical Impressions(s) / UC Diagnoses   Final diagnoses:  Viral URI  Chest tightness  Body aches   Discharge Instructions   None    ED Prescriptions     Medication Sig Dispense Auth. Provider   albuterol (VENTOLIN HFA) 108 (90 Base) MCG/ACT inhaler Inhale 1-2 puffs into the lungs every 6 (six) hours as needed for wheezing or shortness of breath. 18 g Particia Nearing, [place]      PDMP not reviewed this encounter.   Particia Nearing, [place] 06/11/22 1835

## 2022-06-11 NOTE — ED Triage Notes (Signed)
Pt reports headache, chills started today; shortness of breath x 1 week; back pain x 2 years. Ibuprofen gives some relief with back pain.

## 2022-06-12 ENCOUNTER — Telehealth: Payer: Self-pay | Admitting: Internal Medicine

## 2022-06-12 NOTE — Telephone Encounter (Signed)
Pt needs an appt

## 2022-06-12 NOTE — Telephone Encounter (Signed)
Patient called in regard to UC visit on 10/11  Negative for covid and flu   Patient pulse is up (116) at rest . Still having pains in back and stomach ,wants a call back.

## 2022-06-13 ENCOUNTER — Other Ambulatory Visit: Payer: Self-pay

## 2022-06-13 ENCOUNTER — Ambulatory Visit (INDEPENDENT_AMBULATORY_CARE_PROVIDER_SITE_OTHER): Payer: No Typology Code available for payment source | Admitting: Internal Medicine

## 2022-06-13 ENCOUNTER — Encounter: Payer: Self-pay | Admitting: Internal Medicine

## 2022-06-13 VITALS — BP 112/62 | HR 104 | Resp 18 | Ht 63.0 in | Wt 144.0 lb

## 2022-06-13 DIAGNOSIS — Z800 Family history of malignant neoplasm of digestive organs: Secondary | ICD-10-CM | POA: Diagnosis not present

## 2022-06-13 DIAGNOSIS — K5909 Other constipation: Secondary | ICD-10-CM | POA: Diagnosis not present

## 2022-06-13 DIAGNOSIS — N946 Dysmenorrhea, unspecified: Secondary | ICD-10-CM

## 2022-06-13 DIAGNOSIS — K219 Gastro-esophageal reflux disease without esophagitis: Secondary | ICD-10-CM | POA: Diagnosis not present

## 2022-06-13 DIAGNOSIS — N926 Irregular menstruation, unspecified: Secondary | ICD-10-CM

## 2022-06-13 DIAGNOSIS — G8929 Other chronic pain: Secondary | ICD-10-CM | POA: Insufficient documentation

## 2022-06-13 DIAGNOSIS — M545 Low back pain, unspecified: Secondary | ICD-10-CM

## 2022-06-13 MED ORDER — OMEPRAZOLE 40 MG PO CPDR: 40.0000 mg | DELAYED_RELEASE_CAPSULE | Freq: Every day | ORAL | 0 refills | Status: DC

## 2022-06-13 MED ORDER — IBUPROFEN 600 MG PO TABS: 600.0000 mg | ORAL_TABLET | Freq: Three times a day (TID) | ORAL | 0 refills | Status: DC | PRN

## 2022-06-13 NOTE — Assessment & Plan Note (Addendum)
She has history of irregular menstrual cycles UPT test was negative at home Advised to get OB/GYN evaluation May be candidate for OCP Menstrual cramping or ovulation related symptoms may also cause abdominal or pelvic discomfort Ibuprofen as needed for menstrual cramping

## 2022-06-13 NOTE — Assessment & Plan Note (Signed)
Reports bilateral paraspinal area pain with positive SLR today Could be due to muscle strain/sciatica Ibuprofen as needed for back pain Heating pad and/or back brace as needed Avoid heavy lifting and frequent bending Simple back exercises material provided

## 2022-06-13 NOTE — Progress Notes (Signed)
error 

## 2022-06-13 NOTE — Assessment & Plan Note (Addendum)
Reports remote family history of pancreatic cancer in great grandparents Her CMP shows normal LFTs

## 2022-06-13 NOTE — Patient Instructions (Signed)
Please take Ibuprofen for back pain and menstrual cramps.  Please take Omeprazole for abdominal pain. Okay to take GasX or Mylanta for bloating.  Please perform simple back exercises for back pain.  Please get Ob/Gyn evaluation for menstrual abnormality.

## 2022-06-13 NOTE — Telephone Encounter (Signed)
Pt is in [place] & her meds was sent to wrong phar. Can you please resend meds from today to [facility] on [address]????

## 2022-06-13 NOTE — Assessment & Plan Note (Signed)
Her abdominal discomfort could be due to chronic constipation - concern for IBS Referred to GI Advised to take senna or MiraLAX for constipation Needs to improve hydration

## 2022-06-13 NOTE — Progress Notes (Signed)
Acute Office Visit  Subjective:    Patient ID: Karen Sharp, female    DOB: 1991-05-01, 31 y.o.   MRN: [id number]  Chief Complaint  Patient presents with   Follow-up    Follow up from urgent care 10/10 did urinalysis and strep and covid pt having back pain bloated fever and chills since 10/10    HPI Patient is in today for follow-up of recent urgent care visit for likely viral URI.  She had fever, chills, myalgias and fatigue, which have improved now.  Her flu, COVID and strep test were negative.  She denies any nasal congestion, postnasal drip, dyspnea or wheezing currently.  She still complains of abdominal pain, bloating and chronic constipation.  She has tried taking Pepto-Bismol with some relief.  She also has tried MiraLAX for chronic constipation, which helps to relieve the pressure.  She denies any relation to food intake with her symptoms.  She has mild nausea, but denies any vomiting.  She also reports low back pain and paraspinal area pain.  Denies any heavy lifting or frequent bending recently. Her UA was negative for acute infection during urgent care visit.  She has chronic microscopic hematuria, for which she has had urology evaluation in the past.  She also reports menstrual cycle irregularity, which is chronic.  She has not had menstrual period for the last 3 months.  Her urine pregnancy test was negative at home.  Denies any vaginal discharge.  Past Medical History:  Diagnosis Date   Anxiety    Depression     History reviewed. No pertinent surgical history.  History reviewed. No pertinent family history.  Social History   Socioeconomic History   Marital status: Single    Spouse name: Not on file   Number of children: Not on file   Years of education: Not on file   Highest education level: Not on file  Occupational History   Not on file  Tobacco Use   Smoking status: Never   Smokeless tobacco: Never  Vaping Use   Vaping Use: Never used  Substance and  Sexual Activity   Alcohol use: Yes   Drug use: Never    Types: Marijuana   Sexual activity: Yes    Birth control/protection: None  Other Topics Concern   Not on file  Social History Narrative   ** Merged History Encounter **       Social Determinants of Health   Financial Resource Strain: Medium Risk (10/01/2020)   Overall Financial Resource Strain (CARDIA)    Difficulty of Paying Living Expenses: Somewhat hard  Food Insecurity: No Food Insecurity (10/01/2020)   Hunger Vital Sign    Worried About Running Out of Food in the Last Year: Never true    Ran Out of Food in the Last Year: Never true  Transportation Needs: No Transportation Needs (10/01/2020)   PRAPARE - Administrator, Civil Service (Medical): No    Lack of Transportation (Non-Medical): No  Physical Activity: Sufficiently Active (10/01/2020)   Exercise Vital Sign    Days of Exercise per Week: 7 days    Minutes of Exercise per Session: 30 min  Stress: No Stress Concern Present (10/01/2020)   [employer] of Occupational Health - Occupational Stress Questionnaire    Feeling of Stress : Not at all  Social Connections: Moderately Isolated (10/01/2020)   Social Connection and Isolation Panel [NHANES]    Frequency of Communication with Friends and Family: Never    Frequency of  Social Gatherings with Friends and Family: More than three times a week    Attends Religious Services: 1 to 4 times per year    Active Member of [employer] or Organizations: No    Attends Archivist Meetings: Never    Marital Status: Never married  Intimate Partner Violence: Not At Risk (10/01/2020)   Humiliation, Afraid, Rape, and Kick questionnaire    Fear of Current or Ex-Partner: No    Emotionally Abused: No    Physically Abused: No    Sexually Abused: No    Outpatient Medications Prior to Visit  Medication Sig Dispense Refill   albuterol (VENTOLIN HFA) 108 (90 Base) MCG/ACT inhaler Inhale 1-2 puffs into the lungs every 6  (six) hours as needed for wheezing or shortness of breath. 18 g 0   hydrocortisone (ANUSOL-HC) 2.5 % rectal cream Place 1 Application rectally 2 (two) times daily. 30 g 0   nystatin (MYCOSTATIN) 100000 UNIT/ML suspension Take 5 mLs (500,000 Units total) by mouth 4 (four) times daily. 60 mL 0   ibuprofen (ADVIL) 200 MG tablet Take 200 mg by mouth every 6 (six) hours as needed.     itraconazole (SPORANOX) 100 MG capsule Take 1 capsule (100 mg total) by mouth daily. 14 capsule 0   No facility-administered medications prior to visit.    Allergies  Allergen Reactions   Dog Epithelium Allergy Skin Test Hives and Rash    Review of Systems  Constitutional:  Negative for chills and fever.  HENT:  Negative for congestion, sinus pressure, sinus pain and sore throat.   Eyes:  Negative for pain and discharge.  Respiratory:  Negative for cough and shortness of breath.   Cardiovascular:  Negative for chest pain and palpitations.  Gastrointestinal:  Positive for abdominal pain and constipation. Negative for diarrhea, nausea and vomiting.  Endocrine: Negative for polydipsia and polyuria.  Genitourinary:  Negative for dysuria and frequency.  Musculoskeletal:  Positive for back pain. Negative for neck pain and neck stiffness.  Skin:  Negative for rash.  Neurological:  Negative for dizziness and weakness.  Psychiatric/Behavioral:  Negative for agitation and behavioral problems.        Objective:    Physical Exam Vitals reviewed.  Constitutional:      General: She is not in acute distress.    Appearance: She is not diaphoretic.  HENT:     Head: Normocephalic and atraumatic.     Nose: Nose normal.  Eyes:     General: No scleral icterus.    Extraocular Movements: Extraocular movements intact.  Cardiovascular:     Rate and Rhythm: Normal rate and regular rhythm.     Pulses: Normal pulses.     Heart sounds: Normal heart sounds. No murmur heard. Pulmonary:     Breath sounds: Normal breath  sounds. No wheezing or rales.  Abdominal:     Palpations: Abdomen is soft.     Tenderness: There is no abdominal tenderness. There is no right CVA tenderness or left CVA tenderness.  Musculoskeletal:     Cervical back: Neck supple. No tenderness.     Right lower leg: No edema.     Left lower leg: No edema.     Comments: SLR positive b/l  Skin:    General: Skin is warm.     Findings: No rash.  Neurological:     General: No focal deficit present.     Mental Status: She is alert and oriented to person, place, and time.  Psychiatric:  Mood and Affect: Mood normal.        Behavior: Behavior normal.     BP 112/62 (BP Location: Right Arm, Patient Position: Sitting, Cuff Size: Normal)   Pulse (!) 104   Resp 18   Ht 5\' 3"  (1.6 m)   Wt 144 lb (65.3 kg)   LMP  (Within Months)   SpO2 98%   BMI 25.51 kg/m  Wt Readings from Last 3 Encounters:  06/13/22 144 lb (65.3 kg)  02/20/22 139 lb 12.8 oz (63.4 kg)  07/22/21 128 lb 1.3 oz (58.1 kg)        Assessment & Plan:   Problem List Items Addressed This Visit       Digestive   Chronic constipation - Primary    Her abdominal discomfort could be due to chronic constipation - concern for IBS Referred to GI Advised to take senna or MiraLAX for constipation Needs to improve hydration      Relevant Orders   Ambulatory referral to Gastroenterology   Gastroesophageal reflux disease    Epigastric pain could be due to GERD/gastritis Started omeprazole Referred to GI      Relevant Medications   omeprazole (PRILOSEC) 40 MG capsule     Other   Family history of pancreatic cancer    Reports remote family history of pancreatic cancer in great grandparents Her CMP shows normal LFTs       Irregular menstruation    She has history of irregular menstrual cycles UPT test was negative at home Advised to get OB/GYN evaluation May be candidate for OCP Menstrual cramping or ovulation related symptoms may also cause abdominal or  pelvic discomfort Ibuprofen as needed for menstrual cramping      Chronic bilateral low back pain without sciatica    Reports bilateral paraspinal area pain with positive SLR today Could be due to muscle strain/sciatica Ibuprofen as needed for back pain Heating pad and/or back brace as needed Avoid heavy lifting and frequent bending Simple back exercises material provided      Relevant Medications   ibuprofen (ADVIL) 600 MG tablet   Other Visit Diagnoses     Menstrual cramp       Relevant Medications   ibuprofen (ADVIL) 600 MG tablet        Meds ordered this encounter  Medications   ibuprofen (ADVIL) 600 MG tablet    Sig: Take 1 tablet (600 mg total) by mouth every 8 (eight) hours as needed.    Dispense:  30 tablet    Refill:  0   omeprazole (PRILOSEC) 40 MG capsule    Sig: Take 1 capsule (40 mg total) by mouth daily.    Dispense:  30 capsule    Refill:  0     Clarence Dunsmore 07/24/21, MD

## 2022-06-13 NOTE — Assessment & Plan Note (Signed)
Epigastric pain could be due to GERD/gastritis Started omeprazole Referred to GI

## 2022-06-18 ENCOUNTER — Encounter: Payer: Self-pay | Admitting: *Deleted

## 2022-07-20 NOTE — Progress Notes (Deleted)
GI Office Note    Referring Provider: Anabel Halon, MD Primary Care Physician:  Anabel Halon, MD  Primary Gastroenterologist: ***  Chief Complaint   No chief complaint on file.   History of Present Illness   Karen Sharp is a 31 y.o. female presenting today at the request of Anabel Halon, MD for ***constipation.   Previously seen [facility]) in June 2022. Seen for abdominal pain, constipation, and hemorrhoids. RUQ pain. Reported 2-3 BM daily. Haard stools and needed to strain. Pain with Bms. Taking miralax 17g daily but still with hard stools. 2 aunts with pancreatic cancer. Mother with cirrhosis and HCC. Colonoscopy scheduled. Abdominal xray ordered. Advised referral to colorectal surgery for hemorrhoid management. Advised hydration and titrating up miralax. Also adding in fiber supplement. If negative workup then perform sits marker study vs anorectal manometry. Future considerations: Linzess and anorectal manometry.   Colonoscopy 04/19/21: - 3 mm rectosigmoid polyp (hyperplastic) - non bleeding internal hemorrhoids (grade 1) - repeat as needed or at age 30  Labs 02/20/22: CBC, CMP, Lipid panel all within normal limits. Negative HCV.   PCP visit 06/13/22: Epigastric abdominal pain, bloating, constipation. Tried pepto-bismol with some relief. Tried miralax which helps relieve pressure. Mild nausea, no vomiting. Advised senna or miralax, improve hydration. Given omeprazole 40mg  for possible GERD/gastritis.   Today: Epigastric pain -   Constipation -     Current Outpatient Medications  Medication Sig Dispense Refill   albuterol (VENTOLIN HFA) 108 (90 Base) MCG/ACT inhaler Inhale 1-2 puffs into the lungs every 6 (six) hours as needed for wheezing or shortness of breath. 18 g 0   hydrocortisone (ANUSOL-HC) 2.5 % rectal cream Place 1 Application rectally 2 (two) times daily. 30 g 0   ibuprofen (ADVIL) 600 MG tablet Take 1 tablet  (600 mg total) by mouth every 8 (eight) hours as needed. 30 tablet 0   nystatin (MYCOSTATIN) 100000 UNIT/ML suspension Take 5 mLs (500,000 Units total) by mouth 4 (four) times daily. 60 mL 0   omeprazole (PRILOSEC) 40 MG capsule Take 1 capsule (40 mg total) by mouth daily. 30 capsule 0   No current facility-administered medications for this visit.    Past Medical History:  Diagnosis Date   Anxiety    Depression     No past surgical history on file.  No family history on file.  Allergies as of 07/21/2022 - Review Complete 06/13/2022  Allergen Reaction Noted   Dog epithelium allergy skin test Hives and Rash 11/24/2019    Social History   Socioeconomic History   Marital status: Single    Spouse name: Not on file   Number of children: Not on file   Years of education: Not on file   Highest education level: Not on file  Occupational History   Not on file  Tobacco Use   Smoking status: Never   Smokeless tobacco: Never  Vaping Use   Vaping Use: Never used  Substance and Sexual Activity   Alcohol use: Yes   Drug use: Never    Types: Marijuana   Sexual activity: Yes    Birth control/protection: None  Other Topics Concern   Not on file  Social History Narrative   ** Merged History Encounter **       Social Determinants of Health   Financial Resource Strain: Medium Risk (10/01/2020)   Overall Financial Resource Strain (CARDIA)    Difficulty of Paying Living Expenses: Somewhat hard  Food Insecurity: No Food Insecurity (10/01/2020)   Hunger Vital Sign    Worried About Running Out of Food in the Last Year: Never true    Ran Out of Food in the Last Year: Never true  Transportation Needs: No Transportation Needs (10/01/2020)   PRAPARE - Administrator, Civil Service (Medical): No    Lack of Transportation (Non-Medical): No  Physical Activity: Sufficiently Active (10/01/2020)   Exercise Vital Sign    Days of Exercise per Week: 7 days    Minutes of Exercise per  Session: 30 min  Stress: No Stress Concern Present (10/01/2020)   [employer] of Occupational Health - Occupational Stress Questionnaire    Feeling of Stress : Not at all  Social Connections: Moderately Isolated (10/01/2020)   Social Connection and Isolation Panel [NHANES]    Frequency of Communication with Friends and Family: Never    Frequency of Social Gatherings with Friends and Family: More than three times a week    Attends Religious Services: 1 to 4 times per year    Active Member of [employer] or Organizations: No    Attends Banker Meetings: Never    Marital Status: Never married  Intimate Partner Violence: Not At Risk (10/01/2020)   Humiliation, Afraid, Rape, and Kick questionnaire    Fear of Current or Ex-Partner: No    Emotionally Abused: No    Physically Abused: No    Sexually Abused: No     Review of Systems   Gen: Denies any fever, chills, fatigue, weight loss, lack of appetite.  CV: Denies chest pain, heart palpitations, peripheral edema, syncope.  Resp: Denies shortness of breath at rest or with exertion. Denies wheezing or cough.  GI: see HPI GU : Denies urinary burning, urinary frequency, urinary hesitancy MS: Denies joint pain, muscle weakness, cramps, or limitation of movement.  Derm: Denies rash, itching, dry skin Psych: Denies depression, anxiety, memory loss, and confusion Heme: Denies bruising, bleeding, and enlarged lymph nodes.   Physical Exam   There were no vitals taken for this visit.  General:   Alert and oriented. Pleasant and cooperative. Well-nourished and well-developed.  Head:  Normocephalic and atraumatic. Eyes:  Without icterus, sclera clear and conjunctiva pink.  Ears:  Normal auditory acuity. Mouth:  No deformity or lesions, oral mucosa pink.  Lungs:  Clear to auscultation bilaterally. No wheezes, rales, or rhonchi. No distress.  Heart:  S1, S2 present without murmurs appreciated.  Abdomen:  +BS, soft, non-tender and  non-distended. No HSM noted. No guarding or rebound. No masses appreciated.  Rectal:  Deferred *** Msk:  Symmetrical without gross deformities. Normal posture. Extremities:  Without edema. Neurologic:  Alert and  oriented x4;  grossly normal neurologically. Skin:  Intact without significant lesions or rashes. Psych:  Alert and cooperative. Normal mood and affect.   Assessment   Karen Sharp is a 31 y.o. female with a history of constipation, hemorrhoids, anxiety. depression*** presenting today for evaluation of constipation and epigastric pain.  Epigastric pain:   Constipation: Chronic. Previously on miralax. Colonoscopy August 2022 with single hyperplastic polyp and internal hemorrhoids. Has associated abdominal discomfort with bowel movements. Likely IBS.    PLAN   *** Linzess?    Brooke Bonito, MSN, FNP-BC, AGACNP-BC [facility]

## 2022-07-21 ENCOUNTER — Other Ambulatory Visit: Payer: Self-pay

## 2022-07-21 ENCOUNTER — Telehealth: Payer: Self-pay | Admitting: Internal Medicine

## 2022-07-21 ENCOUNTER — Other Ambulatory Visit: Payer: Self-pay | Admitting: Family Medicine

## 2022-07-21 ENCOUNTER — Ambulatory Visit: Payer: No Typology Code available for payment source | Admitting: Gastroenterology

## 2022-07-21 MED ORDER — ALBUTEROL SULFATE HFA 108 (90 BASE) MCG/ACT IN AERS: 1.0000 | INHALATION_SPRAY | Freq: Four times a day (QID) | RESPIRATORY_TRACT | 0 refills | Status: AC | PRN

## 2022-07-21 NOTE — Telephone Encounter (Signed)
Unable to refill per protocol, last refill by provider 07/21/22. Will refuse duplicate request.  Requested Prescriptions  Pending Prescriptions Disp Refills   albuterol (VENTOLIN HFA) 108 (90 Base) MCG/ACT inhaler [Pharmacy Med Name: ALBUTEROL HFA INH (200 PUFFS) 8.5GM] 8.5 g     Sig: INHALE 1 TO 2 PUFFS INTO THE LUNGS EVERY 6 HOURS AS NEEDED FOR WHEEZING OR SHORTNESS OF BREATH     There is no refill protocol information for this order

## 2022-07-21 NOTE — Telephone Encounter (Signed)
Patient needs a refill, but needs a new prescription to refill inhaler.  albuterol (VENTOLIN HFA) 108 (90 Base) MCG/ACT inhaler [[id number]]   Pharmacy  [id number] - [place], [place] - [address] AT SEC OF [address], [place] [address] Phone: [phone]  Fax: [phone] DEA #: [id number]

## 2022-07-21 NOTE — Telephone Encounter (Signed)
Refills sent

## 2022-07-22 ENCOUNTER — Other Ambulatory Visit: Payer: Self-pay | Admitting: Nurse Practitioner

## 2022-07-22 ENCOUNTER — Encounter: Payer: Self-pay | Admitting: Gastroenterology

## 2022-07-22 ENCOUNTER — Other Ambulatory Visit (HOSPITAL_COMMUNITY)
Admission: RE | Admit: 2022-07-22 | Discharge: 2022-07-22 | Disposition: A | Discharge status: Home / Self Care | Payer: No Typology Code available for payment source | Source: Ambulatory Visit | Attending: Nurse Practitioner | Admitting: Nurse Practitioner

## 2022-07-22 ENCOUNTER — Ambulatory Visit (INDEPENDENT_AMBULATORY_CARE_PROVIDER_SITE_OTHER): Payer: No Typology Code available for payment source | Admitting: Gastroenterology

## 2022-07-22 VITALS — BP 107/67 | HR 98 | Temp 98.5°F | Ht 63.0 in | Wt 144.4 lb

## 2022-07-22 DIAGNOSIS — K219 Gastro-esophageal reflux disease without esophagitis: Secondary | ICD-10-CM

## 2022-07-22 DIAGNOSIS — Z124 Encounter for screening for malignant neoplasm of cervix: Secondary | ICD-10-CM | POA: Insufficient documentation

## 2022-07-22 DIAGNOSIS — K5900 Constipation, unspecified: Secondary | ICD-10-CM

## 2022-07-22 MED ORDER — HYDROCORTISONE (PERIANAL) 2.5 % EX CREA: 1.0000 | TOPICAL_CREAM | Freq: Two times a day (BID) | CUTANEOUS | 1 refills | Status: DC

## 2022-07-22 MED ORDER — PANTOPRAZOLE SODIUM 40 MG PO TBEC: 40.0000 mg | DELAYED_RELEASE_TABLET | Freq: Every day | ORAL | 3 refills | Status: DC

## 2022-07-22 NOTE — Progress Notes (Signed)
Gastroenterology Office Note    Referring Provider: Anabel Halon, MD Primary Care Physician:  Anabel Halon, MD  Primary GI: Dr. Marletta Lor    Chief Complaint   Chief Complaint  Patient presents with   New Patient (Initial Visit)    Constipation for years now developing hemorrhoids     History of Present Illness   Karen Sharp is a 31 y.o. female presenting today at the request of Anabel Halon, MD due to constipation and hemorrhoids. She was previously seen at [facility] last year and underwent colonoscopy Aug 2022: one 3 mm polyp (hyperplastic), non-bleeding internal hemorrhoids.  Miralax as needed, usually every few months. Drinks lots of water. BM once to twice a day. Sometimes straining. Abdominal discomfort often. Sometimes improved after BM and sometimes not. Doesn't feel like emptying well. Toilet time 10-15 minutes. No supplemental fiber.   Symptomatic hemorrhoids with bleeding. Associated pressure, sometimes burning. +itching. Prolapsing tissue. Had hemorrhoid. Left lateral hemorrhoid banding in Aug 2022.   Intermittent GERD. Takes Tums as needed. Doesn't like to take lots of prescriptions. Feels full after eating collard greens. No dysphagia. Nausea with fullness.   Maternal Aunt: passed with pancreatic cancer. Paternal Aunt: pancreatic cancer.     Past Medical History:  Diagnosis Date   Anxiety    Constipation    Depression     Past Surgical History:  Procedure Laterality Date   COLONOSCOPY  04/2021   one 3 mm polyp (hyperplastic), non-bleeding internal hemorrhoids   WISDOM TOOTH EXTRACTION      Current Outpatient Medications  Medication Sig Dispense Refill   albuterol (VENTOLIN HFA) 108 (90 Base) MCG/ACT inhaler Inhale 1-2 puffs into the lungs every 6 (six) hours as needed for wheezing or shortness of breath. 18 g 0   hydrocortisone (ANUSOL-HC) 2.5 % rectal cream Place 1 Application rectally 2 (two) times daily. For flared hemorrhoids 30 g 1    ibuprofen (ADVIL) 600 MG tablet Take 1 tablet (600 mg total) by mouth every 8 (eight) hours as needed. 30 tablet 0   pantoprazole (PROTONIX) 40 MG tablet Take 1 tablet (40 mg total) by mouth daily. 30 minutes before breakfast 30 tablet 3   polyethylene glycol powder (GLYCOLAX/MIRALAX) 17 GM/SCOOP powder Take 1 Container by mouth once.     No current facility-administered medications for this visit.    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  Tobacco Use   Smoking status: Never   Smokeless tobacco: Never  Vaping Use   Vaping Use: Never used  Substance and Sexual Activity   Alcohol use: Yes    Comment: occasionally   Drug use: Never   Sexual activity: Yes    Birth control/protection: None  Other Topics Concern   Not on file  Social History Narrative   ** Merged History Encounter **       Social Determinants of Corporate investment banker  Strain: Medium Risk (10/01/2020)   Overall Financial Resource Strain (CARDIA)    Difficulty of Paying Living Expenses: Somewhat hard  Food Insecurity: No Food Insecurity (10/01/2020)   Hunger Vital Sign    Worried About Running Out of Food in the Last Year: Never true    Ran Out of Food in the Last Year: Never true  Transportation Needs: No Transportation Needs (10/01/2020)   PRAPARE - Administrator, Civil Service (Medical): No    Lack of Transportation (Non-Medical): No  Physical Activity: Sufficiently Active (10/01/2020)   Exercise Vital Sign    Days of  Exercise per Week: 7 days    Minutes of Exercise per Session: 30 min  Stress: No Stress Concern Present (10/01/2020)   [employer] of Occupational Health - Occupational Stress Questionnaire    Feeling of Stress : Not at all  Social Connections: Moderately Isolated (10/01/2020)   Social Connection and Isolation Panel [NHANES]    Frequency of Communication with Friends and Family: Never    Frequency of Social Gatherings with Friends and Family: More than three times a week    Attends Religious Services: 1 to 4 times per year    Active Member of [employer] or Organizations: No    Attends Banker Meetings: Never    Marital Status: Never married  Intimate Partner Violence: Not At Risk (10/01/2020)   Humiliation, Afraid, Rape, and Kick questionnaire    Fear of Current or Ex-Partner: No    Emotionally Abused: No    Physically Abused: No    Sexually Abused: No     Review of Systems   Gen: Denies any fever, chills, fatigue, weight loss, lack of appetite.  CV: Denies chest pain, heart palpitations, peripheral edema, syncope.  Resp: Denies shortness of breath at rest or with exertion. Denies wheezing or cough.  GI: see HPI GU : Denies urinary burning, urinary frequency, urinary hesitancy MS: Denies joint pain, muscle weakness, cramps, or limitation of movement.  Derm: Denies rash, itching, dry skin Psych: Denies depression, anxiety, memory loss, and confusion Heme: Denies bruising, bleeding, and enlarged lymph nodes.   Physical Exam   BP 107/67   Pulse 98   Temp 98.5 F (36.9 C)   Ht 5\' 3"  (1.6 m)   Wt 144 lb 6.4 oz (65.5 kg)   LMP 07/02/2022   BMI 25.58 kg/m  General:   Alert and oriented. Pleasant and cooperative. Well-nourished and well-developed.  Head:  Normocephalic and atraumatic. Eyes:  Without icterus Ears:  Normal auditory acuity. Lungs:  Clear to auscultation bilaterally.  Heart:  S1, S2 present without murmurs appreciated.  Abdomen:  +BS, soft, mild  tenderness diffusely and non-distended. No HSM noted. No guarding or rebound. No masses appreciated.  Rectal:  small left lateral hemorrhoid tag externally, no mass on DRE, no pain with DRE.  Msk:  Symmetrical without gross deformities. Normal posture. Extremities:  Without edema. Neurologic:  Alert and  oriented x4;  grossly normal neurologically. Skin:  Intact without significant lesions or rashes. Psych:  Alert and cooperative. Normal mood and affect.   Assessment   Karen Sharp is a 31 y.o. female presenting today at the request of 39, MD due to constipation and hemorrhoids. She was previously seen at [facility] last year and underwent colonoscopy Aug 2022: one 3 mm polyp (hyperplastic), non-bleeding internal hemorrhoids.  Constipation: mild. Does note unproductive stools at times and some straining. Miralax prn, which is rare. Continue with increased water  intake, add Benefiber 2 teaspoons daily for 1-2 weeks. If no improvement, start Linzess 72 mcg daily. Samples provided.   GERD: start pantoprazole daily. No alarm signs/symptoms. Sensation of fullness postprandially but no alarm signs.   Symptomatic hemorrhoids: Grade 2. Follow-up for banding.      PLAN   Start pantoprazole once daily  Benefiber 2 teaspoons daily. Add Linzess 72 mcg daily if needed  Follow-up for banding appt    Gelene Mink, PhD, ANP-BC [facility]

## 2022-07-22 NOTE — Patient Instructions (Addendum)
Start taking pantoprazole once daily, 30 minutes before breakfast. (This is for reflux/indigestion)  Start taking Benefiber 2 teaspoons daily. If no improvement with constipation in next few weeks, trial of Linzess 72 mcg. Linzess works best when taken once a day every day, on an empty stomach, at least 30 minutes before your first meal of the day. You can open up the capsule and mix in water or applesauce.   When Linzess is taken daily as directed:  *Constipation relief is typically felt in about a week *IBS-C patients may begin to experience relief from belly pain and overall abdominal symptoms (pain, discomfort, and bloating) in about 1 week,   with symptoms typically improving over 12 weeks.  Diarrhea may occur in the first 2 weeks -keep taking it.  The diarrhea should go away and you should start having normal, complete, full bowel movements. It may be helpful to start treatment when you can be near the comfort of your own bathroom, such as a weekend.    I will see you in the near future for hemorrhoid banding! Please message if any concerns in the meantime!  It was a pleasure to see you today. I want to create trusting relationships with patients to provide genuine, compassionate, and quality care. I value your feedback. If you receive a survey regarding your visit,  I greatly appreciate you taking time to fill this out.   Gelene Mink, PhD, ANP-BC [facility]

## 2022-07-25 LAB — CYTOLOGY - PAP: Diagnosis: NEGATIVE

## 2022-08-02 ENCOUNTER — Encounter: Payer: Self-pay | Admitting: Internal Medicine

## 2022-08-05 NOTE — Telephone Encounter (Signed)
Contacted patient needs late after 2:00 perferred any provider Friday.

## 2022-08-07 ENCOUNTER — Encounter: Payer: No Typology Code available for payment source | Admitting: Gastroenterology

## 2022-08-08 ENCOUNTER — Ambulatory Visit: Payer: No Typology Code available for payment source | Admitting: Internal Medicine

## 2022-08-21 ENCOUNTER — Encounter: Payer: No Typology Code available for payment source | Admitting: Internal Medicine

## 2022-09-11 ENCOUNTER — Encounter: Payer: No Typology Code available for payment source | Admitting: Gastroenterology

## 2022-09-23 ENCOUNTER — Encounter: Payer: No Typology Code available for payment source | Admitting: Internal Medicine

## 2022-10-23 ENCOUNTER — Encounter: Payer: Self-pay | Admitting: Internal Medicine

## 2022-11-04 ENCOUNTER — Ambulatory Visit (INDEPENDENT_AMBULATORY_CARE_PROVIDER_SITE_OTHER): Payer: BLUE CROSS/BLUE SHIELD | Admitting: Gastroenterology

## 2022-11-04 ENCOUNTER — Encounter: Payer: Self-pay | Admitting: Gastroenterology

## 2022-11-04 ENCOUNTER — Encounter: Payer: Self-pay | Admitting: Internal Medicine

## 2022-11-04 ENCOUNTER — Ambulatory Visit (INDEPENDENT_AMBULATORY_CARE_PROVIDER_SITE_OTHER): Payer: BLUE CROSS/BLUE SHIELD | Admitting: Internal Medicine

## 2022-11-04 VITALS — BP 105/68 | HR 100 | Temp 97.8°F | Ht 65.0 in | Wt 152.4 lb

## 2022-11-04 VITALS — BP 106/67 | HR 132 | Ht 63.0 in | Wt 152.0 lb

## 2022-11-04 DIAGNOSIS — B354 Tinea corporis: Secondary | ICD-10-CM | POA: Diagnosis not present

## 2022-11-04 DIAGNOSIS — R3129 Other microscopic hematuria: Secondary | ICD-10-CM | POA: Diagnosis not present

## 2022-11-04 DIAGNOSIS — F411 Generalized anxiety disorder: Secondary | ICD-10-CM | POA: Insufficient documentation

## 2022-11-04 DIAGNOSIS — Z0001 Encounter for general adult medical examination with abnormal findings: Secondary | ICD-10-CM | POA: Diagnosis not present

## 2022-11-04 DIAGNOSIS — K641 Second degree hemorrhoids: Secondary | ICD-10-CM | POA: Insufficient documentation

## 2022-11-04 DIAGNOSIS — K649 Unspecified hemorrhoids: Secondary | ICD-10-CM | POA: Diagnosis not present

## 2022-11-04 DIAGNOSIS — E559 Vitamin D deficiency, unspecified: Secondary | ICD-10-CM

## 2022-11-04 DIAGNOSIS — E782 Mixed hyperlipidemia: Secondary | ICD-10-CM

## 2022-11-04 MED ORDER — HYDROXYZINE PAMOATE 25 MG PO CAPS: 25.0000 mg | ORAL_CAPSULE | Freq: Two times a day (BID) | ORAL | 0 refills | Status: DC | PRN

## 2022-11-04 NOTE — Assessment & Plan Note (Signed)
Physical exam as documented. Fasting blood tests ordered. 

## 2022-11-04 NOTE — Progress Notes (Signed)
Established Patient Office Visit  Subjective:  Patient ID: Karen Sharp, female    DOB: 1990/09/18  Age: 32 y.o. MRN: [id number]  CC:  Chief Complaint  Patient presents with   Annual Exam    Patient is having hot flashes    HPI Karen Sharp is a 32 y.o. female with past medical history of GERD, constipation and GAD who presents for annual physical.  She had banding of internal hemorrhoids today.  She reports rectal pain.  She has been taking Benefiber and MiraLAX for constipation.  She reports hot flashes.  She is taking OCPs currently.  She reports facial flushing.  She has had episodes of chest tightness in the past as well.  She was given her trial of Protonix for GERD by GI, which had worsened her chest pain and she stopped taking it.  She reports history of anxiety and was given a medicine in the past by her previous PCP, but she does not recall the name. She had mood issues with it. She admits that she worries about multiple things. Denies any SI or HI currently.    Past Medical History:  Diagnosis Date   Anxiety    Constipation    Depression     Past Surgical History:  Procedure Laterality Date   COLONOSCOPY  04/2021   one 3 mm polyp (hyperplastic), non-bleeding internal hemorrhoids   WISDOM TOOTH EXTRACTION      Family History  Problem Relation Age of Onset   Colon cancer Neg Hx    Colon polyps Neg Hx     Social History   Socioeconomic History   Marital status: Single    Spouse name: Not on file   Number of children: Not on file   Years of education: Not on file   Highest education level: Not on file  Occupational History   Occupation: [employer] Worker  Tobacco Use   Smoking status: Never   Smokeless tobacco: Never  Vaping Use   Vaping Use: Never used  Substance and Sexual Activity   Alcohol use: Yes    Comment: occasionally   Drug use: Never   Sexual activity: Yes    Birth control/protection: None  Other Topics  Concern   Not on file  Social History Narrative   ** Merged History Encounter **       Social Determinants of Health   Financial Resource Strain: Medium Risk (10/01/2020)   Overall Financial Resource Strain (CARDIA)    Difficulty of Paying Living Expenses: Somewhat hard  Food Insecurity: No Food Insecurity (10/01/2020)   Hunger Vital Sign    Worried About Running Out of Food in the Last Year: Never true    Ran Out of Food in the Last Year: Never true  Transportation Needs: No Transportation Needs (10/01/2020)   PRAPARE - Hydrologist (Medical): No    Lack of Transportation (Non-Medical): No  Physical Activity: Sufficiently Active (10/01/2020)   Exercise Vital Sign    Days of Exercise per Week: 7 days    Minutes of Exercise per Session: 30 min  Stress: No Stress Concern Present (10/01/2020)   [place]    Feeling of Stress : Not at all  Social Connections: Moderately Isolated (10/01/2020)   Social Connection and Isolation Panel [NHANES]    Frequency of Communication with Friends and Family: Never    Frequency of Social Gatherings with Friends and Family: More  than three times a week    Attends Religious Services: 1 to 4 times per year    Active Member of Clubs or Organizations: No    Attends Archivist Meetings: Never    Marital Status: Never married  Intimate Partner Violence: Not At Risk (10/01/2020)   Humiliation, Afraid, Rape, and Kick questionnaire    Fear of Current or Ex-Partner: No    Emotionally Abused: No    Physically Abused: No    Sexually Abused: No    Outpatient Medications Prior to Visit  Medication Sig Dispense Refill   albuterol (VENTOLIN HFA) 108 (90 Base) MCG/ACT inhaler Inhale 1-2 puffs into the lungs every 6 (six) hours as needed for wheezing or shortness of breath. 18 g 0   hydrocortisone (ANUSOL-HC) 2.5 % rectal cream Place 1 Application rectally 2 (two)  times daily. For flared hemorrhoids 30 g 1   polyethylene glycol powder (GLYCOLAX/MIRALAX) 17 GM/SCOOP powder Take 1 Container by mouth once.     SPRINTEC 28 0.25-35 MG-MCG tablet Take 1 tablet by mouth daily.     No facility-administered medications prior to visit.    Allergies  Allergen Reactions   Dog Epithelium Allergy Skin Test Hives and Rash    ROS Review of Systems  Constitutional:  Negative for chills and fever.  HENT:  Negative for congestion, sinus pressure, sinus pain and sore throat.   Eyes:  Negative for pain and discharge.  Respiratory:  Negative for cough and shortness of breath.   Cardiovascular:  Negative for chest pain and palpitations.  Gastrointestinal:  Positive for abdominal pain and constipation. Negative for diarrhea, nausea and vomiting.  Endocrine: Negative for polydipsia and polyuria.  Genitourinary:  Negative for dysuria and frequency.  Musculoskeletal:  Positive for back pain. Negative for neck pain and neck stiffness.  Skin:  Negative for rash.  Neurological:  Negative for dizziness and weakness.  Psychiatric/Behavioral:  Negative for agitation and behavioral problems.       Objective:    Physical Exam Vitals reviewed.  Constitutional:      General: She is not in acute distress.    Appearance: She is not diaphoretic.  HENT:     Head: Normocephalic and atraumatic.     Nose: Nose normal.  Eyes:     General: No scleral icterus.    Extraocular Movements: Extraocular movements intact.  Cardiovascular:     Rate and Rhythm: Normal rate and regular rhythm.     Pulses: Normal pulses.     Heart sounds: Normal heart sounds. No murmur heard. Pulmonary:     Breath sounds: Normal breath sounds. No wheezing or rales.  Abdominal:     Palpations: Abdomen is soft.     Tenderness: There is no abdominal tenderness. There is no right CVA tenderness or left CVA tenderness.  Musculoskeletal:     Cervical back: Neck supple. No tenderness.     Right lower  leg: No edema.     Left lower leg: No edema.  Skin:    General: Skin is warm.     Findings: No rash.  Neurological:     General: No focal deficit present.     Mental Status: She is alert and oriented to person, place, and time.  Psychiatric:        Mood and Affect: Mood normal.        Behavior: Behavior normal.     BP 106/67 (BP Location: Right Arm, Patient Position: Sitting, Cuff Size: Normal)   Pulse Marland Kitchen)  132   Ht '5\' 3"'$  (1.6 m)   Wt 152 lb (68.9 kg)   LMP 10/17/2022 (Approximate)   SpO2 96%   BMI 26.93 kg/m  Wt Readings from Last 3 Encounters:  11/04/22 152 lb (68.9 kg)  11/04/22 152 lb 6.4 oz (69.1 kg)  07/22/22 144 lb 6.4 oz (65.5 kg)    No results found for: "TSH" Lab Results  Component Value Date   WBC 7.1 02/20/2022   HGB 12.7 02/20/2022   HCT 37.1 02/20/2022   MCV 88 02/20/2022   PLT 236 02/20/2022   Lab Results  Component Value Date   NA 144 02/20/2022   K 3.7 02/20/2022   CO2 26 02/20/2022   GLUCOSE 97 02/20/2022   BUN 9 02/20/2022   CREATININE 0.67 02/20/2022   BILITOT 0.3 02/20/2022   ALKPHOS 68 02/20/2022   AST 22 02/20/2022   ALT 13 02/20/2022   PROT 6.8 02/20/2022   ALBUMIN 4.2 02/20/2022   CALCIUM 9.4 02/20/2022   ANIONGAP 7 03/27/2020   EGFR 122 02/20/2022   Lab Results  Component Value Date   CHOL 161 02/20/2022   Lab Results  Component Value Date   HDL 52 02/20/2022   Lab Results  Component Value Date   LDLCALC 86 02/20/2022   Lab Results  Component Value Date   TRIG 131 02/20/2022   Lab Results  Component Value Date   CHOLHDL 3.1 02/20/2022   No results found for: "HGBA1C"    Assessment & Plan:   Problem List Items Addressed This Visit       Cardiovascular and Mediastinum   Hemorrhoids    Anusol cream as needed Has had band ligation, has seen colorectal surgeon Advised to take senna or MiraLAX for constipation Needs to improve hydration        Musculoskeletal and Integument   Tinea corporis    Resolved  now      Relevant Orders   CBC with Differential/Platelet     Genitourinary   Microscopic hematuria    Has had urology eval, CT abdomen and US abdomen negative for renal mass, stone or hydronephrosis Recent episode of questionable hematuria, could be related to blood mixed with urine - recently had UTI as well Check UA If persistent hematuria, will refer to Urology      Relevant Orders   Urinalysis, Routine w reflex microscopic   CBC with Differential/Platelet     Other   Encounter for general adult medical examination with abnormal findings - Primary    Physical exam as documented. Fasting blood tests ordered.      Relevant Orders   VITAMIN D 25 Hydroxy (Vit-D Deficiency, Fractures)   TSH   Lipid panel   CMP14+EGFR   CBC with Differential/Platelet   GAD (generalized anxiety disorder)    Her facial flushing and chest tightness seem to be due to GAD Has tried SSRI/SNRI in the past, but did not tolerate it Started Vistaril PRN for now [facility] therapy, she wants to look in her area [facility]) She asked for a letter for rescue pat, but she would need it from Psychiatry - she expressed understanding.      Relevant Medications   hydrOXYzine (VISTARIL) 25 MG capsule   Other Relevant Orders   TSH   CMP14+EGFR   Other Visit Diagnoses     Mixed hyperlipidemia       Relevant Orders   Lipid panel   Vitamin D deficiency       Relevant Orders  VITAMIN D 25 Hydroxy (Vit-D Deficiency, Fractures)       Meds ordered this encounter  Medications   hydrOXYzine (VISTARIL) 25 MG capsule    Sig: Take 1 capsule (25 mg total) by mouth every 12 (twelve) hours as needed for anxiety.    Dispense:  30 capsule    Refill:  0    Follow-up: Return in about 6 months (around 05/07/2023) for GAD.    Lindell Spar, MD

## 2022-11-04 NOTE — Assessment & Plan Note (Signed)
Anusol cream as needed Has had band ligation, has seen colorectal surgeon Advised to take senna or MiraLAX for constipation Needs to improve hydration

## 2022-11-04 NOTE — Assessment & Plan Note (Signed)
Her facial flushing and chest tightness seem to be due to GAD Has tried SSRI/SNRI in the past, but did not tolerate it Started Vistaril PRN for now [facility] therapy, she wants to look in her area [facility]) She asked for a letter for rescue pat, but she would need it from Psychiatry - she expressed understanding.

## 2022-11-04 NOTE — Patient Instructions (Signed)
Please take Hydroxyzine as prescribed.  Please take Lutricia Feil' health multivitamin once daily.  Please continue to take at least 64 ounces of fluid in a day.

## 2022-11-04 NOTE — Assessment & Plan Note (Addendum)
Has had urology eval, CT abdomen and US abdomen negative for renal mass, stone or hydronephrosis Recent episode of questionable hematuria, could be related to blood mixed with urine - recently had UTI as well Check UA If persistent hematuria, will refer to Urology

## 2022-11-04 NOTE — Assessment & Plan Note (Signed)
Resolved now. 

## 2022-11-04 NOTE — Progress Notes (Signed)
    [place] BANDING PROCEDURE NOTE  Karen Sharp is a 32 y.o. female presenting today for consideration of hemorrhoid banding. Last colonoscopy  Aug 2022: one 3 mm polyp (hyperplastic), non-bleeding internal hemorrhoids at [facility]. She had left lateral banding at other facility in Aug 2022. Notes bleeding, itching, pressure.    The patient presents with symptomatic grade 2 hemorrhoids, unresponsive to maximal medical therapy, requesting rubber band ligation of her hemorrhoidal disease. All risks, benefits, and alternative forms of therapy were described and informed consent was obtained.  In the left lateral decubitus position, anoscopic examination revealed grade 2 hemorrhoids in the right anterior and right position (s).  The decision was made to band the right posterior internal hemorrhoid, and the [place] was used to perform band ligation without complication. Digital anorectal examination was then performed to assure proper positioning of the band, and to adjust the banded tissue as required. The patient was discharged home without pain or other issues. Dietary and behavioral recommendations were given, along with follow-up instructions. The patient will return in several weeks for followup and possible additional banding as required.  No complications were encountered and the patient tolerated the procedure well.   Annitta Needs, PhD, ANP-BC [facility]

## 2022-11-04 NOTE — Patient Instructions (Signed)
  Please avoid straining.  You should limit your toilet time to 2-3 minutes at the most.   I recommend Benefiber 2 teaspoons each morning in the beverage of your choice!  Please call me with any concerns or issues!  I will see you in follow-up for additional banding in several weeks.    I enjoyed seeing you again today! At our first visit, I mentioned how I value our relationship and want to provide genuine, compassionate, and quality care. You may receive a survey regarding your visit with me, and I welcome your feedback! Thanks so much for taking the time to complete this. I look forward to seeing you again.   Jesse Nosbisch W. Brenon Antosh, PhD, ANP-BC [facility]      

## 2022-11-05 ENCOUNTER — Other Ambulatory Visit: Payer: Self-pay | Admitting: Internal Medicine

## 2022-11-05 DIAGNOSIS — R3129 Other microscopic hematuria: Secondary | ICD-10-CM

## 2022-11-05 LAB — CBC WITH DIFFERENTIAL/PLATELET
Basophils Absolute: 0 10*3/uL (ref 0.0–0.2)
Basos: 0 %
EOS (ABSOLUTE): 0.1 10*3/uL (ref 0.0–0.4)
Eos: 1 %
Hematocrit: 39 % (ref 34.0–46.6)
Hemoglobin: 12.8 g/dL (ref 11.1–15.9)
Immature Grans (Abs): 0 10*3/uL (ref 0.0–0.1)
Immature Granulocytes: 0 %
Lymphocytes Absolute: 2.1 10*3/uL (ref 0.7–3.1)
Lymphs: 22 %
MCH: 29 pg (ref 26.6–33.0)
MCHC: 32.8 g/dL (ref 31.5–35.7)
MCV: 88 fL (ref 79–97)
Monocytes Absolute: 0.5 10*3/uL (ref 0.1–0.9)
Monocytes: 5 %
Neutrophils Absolute: 6.8 10*3/uL (ref 1.4–7.0)
Neutrophils: 72 %
Platelets: 263 10*3/uL (ref 150–450)
RBC: 4.42 x10E6/uL (ref 3.77–5.28)
RDW: 12.3 % (ref 11.7–15.4)
WBC: 9.6 10*3/uL (ref 3.4–10.8)

## 2022-11-05 LAB — CMP14+EGFR
ALT: 15 IU/L (ref 0–32)
AST: 20 IU/L (ref 0–40)
Albumin/Globulin Ratio: 1.6 (ref 1.2–2.2)
Albumin: 4.2 g/dL (ref 4.0–5.0)
Alkaline Phosphatase: 63 IU/L (ref 44–121)
BUN/Creatinine Ratio: 15 (ref 9–23)
BUN: 9 mg/dL (ref 6–20)
Bilirubin Total: <0.2 mg/dL (ref 0.0–1.2)
CO2: 20 mmol/L (ref 20–29)
Calcium: 9.3 mg/dL (ref 8.7–10.2)
Chloride: 101 mmol/L (ref 96–106)
Creatinine, Ser: 0.6 mg/dL (ref 0.57–1.00)
Globulin, Total: 2.7 g/dL (ref 1.5–4.5)
Glucose: 87 mg/dL (ref 70–99)
Potassium: 4.1 mmol/L (ref 3.5–5.2)
Sodium: 138 mmol/L (ref 134–144)
Total Protein: 6.9 g/dL (ref 6.0–8.5)
eGFR: 125 mL/min/{1.73_m2} (ref >59)

## 2022-11-05 LAB — VITAMIN D 25 HYDROXY (VIT D DEFICIENCY, FRACTURES): Vit D, 25-Hydroxy: 17.4 ng/mL — ABNORMAL LOW (ref 30.0–100.0)

## 2022-11-05 LAB — MICROSCOPIC EXAMINATION
Bacteria, UA: NONE SEEN
Casts: NONE SEEN /lpf
WBC, UA: NONE SEEN /hpf (ref 0–5)

## 2022-11-05 LAB — LIPID PANEL
Chol/HDL Ratio: 2.9 ratio (ref 0.0–4.4)
Cholesterol, Total: 153 mg/dL (ref 100–199)
HDL: 53 mg/dL (ref >39)
LDL Chol Calc (NIH): 79 mg/dL (ref 0–99)
Triglycerides: 119 mg/dL (ref 0–149)
VLDL Cholesterol Cal: 21 mg/dL (ref 5–40)

## 2022-11-05 LAB — URINALYSIS, ROUTINE W REFLEX MICROSCOPIC
Bilirubin, UA: NEGATIVE
Glucose, UA: NEGATIVE
Ketones, UA: NEGATIVE
Leukocytes,UA: NEGATIVE
Nitrite, UA: NEGATIVE
Protein,UA: NEGATIVE
Specific Gravity, UA: 1.025 (ref 1.005–1.030)
Urobilinogen, Ur: 0.2 mg/dL (ref 0.2–1.0)
pH, UA: 6.5 (ref 5.0–7.5)

## 2022-11-05 LAB — TSH: TSH: 2.39 u[IU]/mL (ref 0.450–4.500)

## 2022-11-05 MED ORDER — LIDOCAINE 5 % EX OINT: 1.0000 | TOPICAL_OINTMENT | Freq: Four times a day (QID) | CUTANEOUS | 0 refills | Status: AC | PRN

## 2022-11-05 MED ORDER — HYDROCORTISONE (PERIANAL) 2.5 % EX CREA: 1.0000 | TOPICAL_CREAM | Freq: Two times a day (BID) | CUTANEOUS | 1 refills | Status: AC

## 2022-11-05 NOTE — Telephone Encounter (Signed)
Spoke with patient personally. Notes throbbing and discomfort inside rectum. She is unable to come today to have rectal exam. Some discomfort running down right leg. I suspect spasms, may have had flare of fissure. No urinary retention, fever/chills.   I have refilled Anusol cream and also sent Lidocaine to her pharmacy in [place]. She is to update me tomorrow. May need course of nitroglycerin. Suspected relief in next 24 hours.

## 2022-11-17 ENCOUNTER — Telehealth: Payer: Self-pay

## 2022-11-17 NOTE — Telephone Encounter (Signed)
Karen Sharp, Documentation from pt's insurance regarding the denial reasons for the pt's Lidocaine Rx. Can she not purchase over the counter? On your desk in the yellow folder.

## 2022-11-18 MED ORDER — LINACLOTIDE 72 MCG PO CAPS: 72.0000 ug | ORAL_CAPSULE | Freq: Every day | ORAL | 3 refills | Status: DC

## 2022-11-20 NOTE — Telephone Encounter (Signed)
May get OTC. Won't be approved via insurance as OTC.

## 2022-11-20 NOTE — Telephone Encounter (Signed)
Phoned and advised the pt of the purchasing OTC of Lidocaine. The pt stated she had already done that and it helped

## 2022-12-06 ENCOUNTER — Encounter: Payer: Self-pay | Admitting: Internal Medicine

## 2022-12-06 ENCOUNTER — Other Ambulatory Visit: Payer: Self-pay | Admitting: Internal Medicine

## 2022-12-06 DIAGNOSIS — F411 Generalized anxiety disorder: Secondary | ICD-10-CM

## 2022-12-16 ENCOUNTER — Encounter: Payer: Self-pay | Admitting: Gastroenterology

## 2022-12-16 ENCOUNTER — Ambulatory Visit (INDEPENDENT_AMBULATORY_CARE_PROVIDER_SITE_OTHER): Payer: BLUE CROSS/BLUE SHIELD | Admitting: Gastroenterology

## 2022-12-16 VITALS — BP 106/71 | HR 102 | Temp 97.5°F | Ht 64.0 in | Wt 158.4 lb

## 2022-12-16 DIAGNOSIS — K641 Second degree hemorrhoids: Secondary | ICD-10-CM | POA: Diagnosis not present

## 2022-12-16 NOTE — Patient Instructions (Signed)
  Please avoid straining.  You should limit your toilet time to 2-3 minutes at the most.   I recommend Benefiber 2 teaspoons each morning in the beverage of your choice!  Please call me with any concerns or issues!  I will see you in follow-up in 6 months!  I enjoyed seeing you again today! At our first visit, I mentioned how I value our relationship and want to provide genuine, compassionate, and quality care. You may receive a survey regarding your visit with me, and I welcome your feedback! Thanks so much for taking the time to complete this. I look forward to seeing you again.   Gelene Mink, PhD, ANP-BC [facility]

## 2022-12-16 NOTE — Progress Notes (Signed)
       [facility] BANDING PROCEDURE NOTE  Karen Sharp is a 32 y.o. female presenting today for consideration of hemorrhoid banding. Last colonoscopy  Aug 2022: one 3 mm polyp (hyperplastic), non-bleeding internal hemorrhoids at [facility]. She had left lateral banding at other facility in Aug 2022. Right posterior banding here in March 2024.    The patient presents with symptomatic grade 2 hemorrhoids, unresponsive to maximal medical therapy, requesting rubber band ligation of her hemorrhoidal disease. All risks, benefits, and alternative forms of therapy were described and informed consent was obtained.   The decision was made to band the right anterior internal hemorrhoid, and the [facility] System was used to perform band ligation without complication. Digital anorectal examination was then performed to assure proper positioning of the band, and to adjust the banded tissue as required. The patient was discharged home without pain or other issues. Dietary and behavioral recommendations were given, along with follow-up instructions. The patient will return in 6 months for routine follow-up. Continue Linzess 72 mcg daily, as this is working well.   No complications were encountered and the patient tolerated the procedure well.   Gelene Mink, PhD, ANP-BC [facility]

## 2023-01-08 ENCOUNTER — Encounter: Payer: Self-pay | Admitting: Internal Medicine

## 2023-01-08 ENCOUNTER — Ambulatory Visit: Payer: BLUE CROSS/BLUE SHIELD | Admitting: Urology

## 2023-01-19 ENCOUNTER — Telehealth: Payer: Self-pay | Admitting: Internal Medicine

## 2023-01-19 ENCOUNTER — Other Ambulatory Visit: Payer: Self-pay | Admitting: Internal Medicine

## 2023-01-19 DIAGNOSIS — R3129 Other microscopic hematuria: Secondary | ICD-10-CM

## 2023-01-19 NOTE — Telephone Encounter (Signed)
Patient called need another urologist instead  of [place].  [facility] in [place].  [phone] ED [address] [place]. Patient works at [employer] easier for patient.

## 2023-01-19 NOTE — Telephone Encounter (Signed)
Changed referral for pt to preferred site

## 2023-05-07 ENCOUNTER — Ambulatory Visit (INDEPENDENT_AMBULATORY_CARE_PROVIDER_SITE_OTHER): Payer: BC Managed Care – PPO | Admitting: Internal Medicine

## 2023-05-07 ENCOUNTER — Encounter: Payer: Self-pay | Admitting: Internal Medicine

## 2023-05-07 VITALS — BP 107/70 | HR 103 | Ht 64.0 in | Wt 160.4 lb

## 2023-05-07 DIAGNOSIS — K5904 Chronic idiopathic constipation: Secondary | ICD-10-CM | POA: Diagnosis not present

## 2023-05-07 DIAGNOSIS — F411 Generalized anxiety disorder: Secondary | ICD-10-CM

## 2023-05-07 DIAGNOSIS — Z2821 Immunization not carried out because of patient refusal: Secondary | ICD-10-CM

## 2023-05-07 DIAGNOSIS — E282 Polycystic ovarian syndrome: Secondary | ICD-10-CM

## 2023-05-07 DIAGNOSIS — E559 Vitamin D deficiency, unspecified: Secondary | ICD-10-CM

## 2023-05-07 DIAGNOSIS — R221 Localized swelling, mass and lump, neck: Secondary | ICD-10-CM

## 2023-05-07 MED ORDER — VITAMIN D (ERGOCALCIFEROL) 1.25 MG (50000 UNIT) PO CAPS: 50000.0000 [IU] | ORAL_CAPSULE | ORAL | 1 refills | Status: AC

## 2023-05-07 MED ORDER — HYDROXYZINE HCL 10 MG PO TABS: 10.0000 mg | ORAL_TABLET | Freq: Two times a day (BID) | ORAL | 0 refills | Status: AC | PRN

## 2023-05-07 NOTE — Assessment & Plan Note (Addendum)
Followed by Ob.Gyn. On OCPs Has hot flashes, unclear if related to PCOS TSH wnl

## 2023-05-07 NOTE — Patient Instructions (Signed)
Please take Vitamin D 50,000 IU once weekly.  Please start taking Vitamin B12 500 mcg once daily.  Take Hydroxyzine 10 mg as needed for anxiety or insomnia.

## 2023-05-07 NOTE — Assessment & Plan Note (Signed)
Unclear if enlarged lymph node versus cyst Check [place] of neck - prefers it with [facility]

## 2023-05-07 NOTE — Assessment & Plan Note (Addendum)
Her abdominal discomfort could be due to chronic constipation - concern for IBS Referred to GI - started Linzess, now better controlled Maintain adequate hydration

## 2023-05-07 NOTE — Assessment & Plan Note (Addendum)
Had facial flushing and chest tightness, seem to be due to GAD - now resolved Has tried SSRI/SNRI in the past, but did not tolerate it Had started Vistaril 25 mg PRN,, but had drowsiness -decreased dose to 10 mg as needed Offered [facility] therapy, she wants to look in her area [facility])

## 2023-05-07 NOTE — Progress Notes (Signed)
Established Patient Office Visit  Subjective:  Patient ID: Karen Sharp, female    DOB: 1991/01/11  Age: 32 y.o. MRN: [id number]  CC:  Chief Complaint  Patient presents with   Anxiety    Follow up     HPI Karen Sharp is a 32 y.o. female with past medical history of GERD, constipation and GAD who presents for f/u of her chronic medical conditions.  GAD: She felt better with as needed Vistaril 25 mg, but reports feeling drowsy the next day.  She denies anhedonia, SI or HI currently.  Work stress is manageable currently.  Hot flashes: She reports hot flashes and had blood tests with OB/GYN.  She was told of PCOS and was placed on OCPs, which has helped with irregular menstrual cycles.  Fatigue: She reports chronic fatigue.  Her last blood tests showed low vitamin D, but she reports that she had started low-dose of vitamin D supplement.  Denies any chronic cough, hemoptysis, nausea, vomiting, melena, hematochezia, fever, chills, recent weight loss, night sweats or LAD.  Neck mass: She reports a masslike structure noted on right side of neck.  She has had recurrent increase in size lately.  She had COVID infection about 2 weeks ago.  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  Tobacco Use   Smoking status: Never   Smokeless tobacco: Never  Vaping Use   Vaping status: Never Used  Substance  and Sexual Activity   Alcohol use: Yes    Comment: occasionally   Drug use: Never   Sexual activity: Yes    Birth control/protection: None  Other Topics Concern   Not on file  Social History Narrative   ** Merged History Encounter **       Social Determinants of Health   Financial Resource Strain: Medium Risk (10/01/2020)   Overall Financial Resource Strain (CARDIA)    Difficulty of Paying Living Expenses: Somewhat hard  Food Insecurity: No Food Insecurity (10/01/2020)   Hunger Vital Sign    Worried About Running Out of Food in the Last Year: Never true    Ran Out of Food in the Last Year: Never true  Transportation Needs: No Transportation Needs (10/01/2020)   PRAPARE - Administrator, Civil Service (Medical): No    Lack of Transportation (Non-Medical): No  Physical Activity: Sufficiently Active (10/01/2020)   Exercise Vital Sign    Days of Exercise per Week: 7 days    Minutes of Exercise per Session: 30 min  Stress: No Stress Concern Present (10/01/2020)   [employer] of Occupational Health - Occupational Stress Questionnaire    Feeling of Stress : Not at all  Social Connections: Moderately Isolated (10/01/2020)   Social Connection and Isolation Panel [NHANES]  Frequency of Communication with Friends and Family: Never    Frequency of Social Gatherings with Friends and Family: More than three times a week    Attends Religious Services: 1 to 4 times per year    Active Member of [employer] or Organizations: No    Attends Banker Meetings: Never    Marital Status: Never married  Intimate Partner Violence: Not At Risk (10/01/2020)   Humiliation, Afraid, Rape, and Kick questionnaire    Fear of Current or Ex-Partner: No    Emotionally Abused: No    Physically Abused: No    Sexually Abused: No    Outpatient Medications Prior to Visit  Medication Sig Dispense Refill   albuterol (VENTOLIN HFA) 108 (90 Base) MCG/ACT inhaler Inhale 1-2 puffs into the lungs  every 6 (six) hours as needed for wheezing or shortness of breath. 18 g 0   hydrocortisone (ANUSOL-HC) 2.5 % rectal cream Place 1 Application rectally 2 (two) times daily. For flared hemorrhoids 30 g 1   lidocaine (XYLOCAINE) 5 % ointment Apply 1 Application topically 4 (four) times daily as needed. To rectum for discomfort 30 g 0   linaclotide (LINZESS) 72 MCG capsule Take 1 capsule (72 mcg total) by mouth daily before breakfast. 90 capsule 3   Multiple Vitamin (MULTIVITAMIN) tablet Take 1 tablet by mouth daily.     SPRINTEC 28 0.25-35 MG-MCG tablet Take 1 tablet by mouth daily.     cholecalciferol (VITAMIN D3) 25 MCG (1000 UNIT) tablet Take 1,000 Units by mouth daily.     hydrOXYzine (VISTARIL) 25 MG capsule TAKE 1 CAPSULE BY MOUTH EVERY 12 HOURS AS NEEDED FOR ANXIETY 30 capsule 0   No facility-administered medications prior to visit.    Allergies  Allergen Reactions   Dog Epithelium (Canis Lupus Familiaris) Hives and Rash    ROS Review of Systems  Constitutional:  Negative for chills and fever.  HENT:  Negative for congestion, sinus pressure, sinus pain and sore throat.   Eyes:  Negative for pain and discharge.  Respiratory:  Negative for cough and shortness of breath.   Cardiovascular:  Negative for chest pain and palpitations.  Gastrointestinal:  Positive for constipation. Negative for diarrhea, nausea and vomiting.  Endocrine: Negative for polydipsia and polyuria.  Genitourinary:  Negative for dysuria and frequency.  Musculoskeletal:  Negative for neck pain and neck stiffness.  Skin:  Negative for rash.  Neurological:  Negative for dizziness and weakness.  Psychiatric/Behavioral:  Negative for agitation and behavioral problems. The patient is nervous/anxious.       Objective:    Physical Exam Vitals reviewed.  Constitutional:      General: She is not in acute distress.    Appearance: She is not diaphoretic.  HENT:     Head: Normocephalic and atraumatic.     Nose: Nose  normal.  Eyes:     General: No scleral icterus.    Extraocular Movements: Extraocular movements intact.  Neck:     Thyroid: No thyroid tenderness.     Comments: About 1 cm in diameter nontender mass in the posterior cervical lymph node area Cardiovascular:     Rate and Rhythm: Normal rate and regular rhythm.     Pulses: Normal pulses.     Heart sounds: Normal heart sounds. No murmur heard. Pulmonary:     Breath sounds: Normal breath sounds. No wheezing or rales.  Musculoskeletal:     Cervical back: Neck supple. No tenderness.     Right lower leg: No  edema.     Left lower leg: No edema.  Skin:    General: Skin is warm.     Findings: No rash.  Neurological:     General: No focal deficit present.     Mental Status: She is alert and oriented to person, place, and time.  Psychiatric:        Mood and Affect: Mood normal.        Behavior: Behavior normal.     BP 107/70 (BP Location: Right Arm, Patient Position: Sitting, Cuff Size: Normal)   Pulse (!) 103   Ht 5\' 4"  (1.626 m)   Wt 160 lb 6.4 oz (72.8 kg)   SpO2 98%   BMI 27.53 kg/m  Wt Readings from Last 3 Encounters:  05/07/23 160 lb 6.4 oz (72.8 kg)  12/16/22 158 lb 6.4 oz (71.8 kg)  11/04/22 152 lb (68.9 kg)    Lab Results  Component Value Date   TSH 2.390 11/04/2022   Lab Results  Component Value Date   WBC 9.6 11/04/2022   HGB 12.8 11/04/2022   HCT 39.0 11/04/2022   MCV 88 11/04/2022   PLT 263 11/04/2022   Lab Results  Component Value Date   NA 138 11/04/2022   K 4.1 11/04/2022   CO2 20 11/04/2022   GLUCOSE 87 11/04/2022   BUN 9 11/04/2022   CREATININE 0.60 11/04/2022   BILITOT <0.2 11/04/2022   ALKPHOS 63 11/04/2022   AST 20 11/04/2022   ALT 15 11/04/2022   PROT 6.9 11/04/2022   ALBUMIN 4.2 11/04/2022   CALCIUM 9.3 11/04/2022   ANIONGAP 7 03/27/2020   EGFR 125 11/04/2022   Lab Results  Component Value Date   CHOL 153 11/04/2022   Lab Results  Component Value Date   HDL 53 11/04/2022   Lab  Results  Component Value Date   LDLCALC 79 11/04/2022   Lab Results  Component Value Date   TRIG 119 11/04/2022   Lab Results  Component Value Date   CHOLHDL 2.9 11/04/2022   No results found for: "HGBA1C"    Assessment & Plan:   Problem List Items Addressed This Visit       Endocrine   PCOS (polycystic ovarian syndrome)    Followed by Ob.Gyn. On OCPs Has hot flashes, unclear if related to PCOS TSH wnl        Other   Constipation    Her abdominal discomfort could be due to chronic constipation - concern for IBS Referred to GI - started Linzess, now better controlled Maintain adequate hydration      GAD (generalized anxiety disorder) - Primary    Had facial flushing and chest tightness, seem to be due to GAD - now resolved Has tried SSRI/SNRI in the past, but did not tolerate it Had started Vistaril 25 mg PRN,, but had drowsiness -decreased dose to 10 mg as needed Offered BH therapy, she wants to look in her area [facility])      Relevant Medications   hydrOXYzine (ATARAX) 10 MG tablet   Vitamin D deficiency    Last vitamin D Lab Results  Component Value Date   VD25OH 17.4 (L) 11/04/2022   Fatigue could be due to nutritional deficiency Started vitamin D 50,000 IU qw      Relevant Medications   Vitamin D, Ergocalciferol, (DRISDOL) 1.25 MG (50000 UNIT) CAPS capsule   Neck mass    Unclear if enlarged lymph node versus cyst Check [place] of neck - prefers it with [facility]  Relevant Orders   US Soft Tissue Head/Neck (NON-THYROID)   Other Visit Diagnoses     Refused influenza vaccine           Meds ordered this encounter  Medications   Vitamin D, Ergocalciferol, (DRISDOL) 1.25 MG (50000 UNIT) CAPS capsule    Sig: Take 1 capsule (50,000 Units total) by mouth every 7 (seven) days.    Dispense:  12 capsule    Refill:  1   hydrOXYzine (ATARAX) 10 MG tablet    Sig: Take 1 tablet (10 mg total) by mouth 2 (two) times daily as needed for anxiety.    Dispense:   30 tablet    Refill:  0    Follow-up: Return in about 6 months (around 11/04/2023).    Anabel Halon, MD

## 2023-05-07 NOTE — Assessment & Plan Note (Signed)
Last vitamin D Lab Results  Component Value Date   VD25OH 17.4 (L) 11/04/2022   Fatigue could be due to nutritional deficiency Started vitamin D 50,000 IU qw

## 2023-05-12 ENCOUNTER — Encounter: Payer: Self-pay | Admitting: Internal Medicine

## 2023-05-22 ENCOUNTER — Encounter: Payer: Self-pay | Admitting: Internal Medicine

## 2023-05-22 ENCOUNTER — Other Ambulatory Visit: Payer: Self-pay | Admitting: Internal Medicine

## 2023-05-22 DIAGNOSIS — N644 Mastodynia: Secondary | ICD-10-CM

## 2023-05-27 ENCOUNTER — Encounter: Payer: Self-pay | Admitting: Internal Medicine

## 2023-05-28 ENCOUNTER — Other Ambulatory Visit (HOSPITAL_COMMUNITY): Payer: BC Managed Care – PPO

## 2023-05-28 ENCOUNTER — Encounter (HOSPITAL_COMMUNITY): Payer: BC Managed Care – PPO

## 2023-11-04 ENCOUNTER — Encounter: Payer: Self-pay | Admitting: Internal Medicine

## 2023-11-04 ENCOUNTER — Ambulatory Visit (INDEPENDENT_AMBULATORY_CARE_PROVIDER_SITE_OTHER): Payer: BC Managed Care – PPO | Admitting: Internal Medicine

## 2023-11-04 VITALS — BP 105/70 | HR 109 | Ht 63.0 in | Wt 161.6 lb

## 2023-11-04 DIAGNOSIS — E782 Mixed hyperlipidemia: Secondary | ICD-10-CM

## 2023-11-04 DIAGNOSIS — F411 Generalized anxiety disorder: Secondary | ICD-10-CM

## 2023-11-04 DIAGNOSIS — E282 Polycystic ovarian syndrome: Secondary | ICD-10-CM

## 2023-11-04 DIAGNOSIS — Z0001 Encounter for general adult medical examination with abnormal findings: Secondary | ICD-10-CM | POA: Diagnosis not present

## 2023-11-04 DIAGNOSIS — K5904 Chronic idiopathic constipation: Secondary | ICD-10-CM

## 2023-11-04 DIAGNOSIS — R3129 Other microscopic hematuria: Secondary | ICD-10-CM

## 2023-11-04 DIAGNOSIS — E559 Vitamin D deficiency, unspecified: Secondary | ICD-10-CM | POA: Diagnosis not present

## 2023-11-04 DIAGNOSIS — E538 Deficiency of other specified B group vitamins: Secondary | ICD-10-CM

## 2023-11-04 NOTE — Assessment & Plan Note (Addendum)
 Her abdominal discomfort was likely due to chronic constipation Referred to GI - started Linzess, now better controlled Maintain adequate hydration

## 2023-11-04 NOTE — Assessment & Plan Note (Addendum)
 Physical exam as documented. Fasting blood tests ordered. DMV forms signed.

## 2023-11-04 NOTE — Assessment & Plan Note (Signed)
 Has had urology eval, CT abdomen and US abdomen negative for renal mass, stone or hydronephrosis Reports having normal cystoscopy as well

## 2023-11-04 NOTE — Assessment & Plan Note (Signed)
 Last vitamin D Lab Results  Component Value Date   VD25OH 17.4 (L) 11/04/2022   Fatigue could be due to nutritional deficiency Completed vitamin D 50,000 IU qw for 6 months

## 2023-11-04 NOTE — Assessment & Plan Note (Addendum)
 Had facial flushing and chest tightness, seem to be due to GAD - now resolved Has tried SSRI/SNRI in the past, but did not tolerate it Had started Vistaril 25 mg PRN,, but had drowsiness -decreased dose to 10 mg as needed - tolerating better Referred to [facility] therapy

## 2023-11-04 NOTE — Patient Instructions (Signed)
 Please continue to take medications as prescribed.  Please continue to follow heart healthy diet and perform moderate exercise/walking at least 150 mins/week.

## 2023-11-04 NOTE — Assessment & Plan Note (Signed)
Followed by Ob.Gyn. On OCPs Has hot flashes, unclear if related to PCOS TSH wnl

## 2023-11-04 NOTE — Progress Notes (Signed)
 Established Patient Office Visit  Subjective:  Patient ID: Karen Sharp, female    DOB: Feb 13, 1991  Age: 33 y.o. MRN: [id number]  CC:  Chief Complaint  Patient presents with   Annual Exam    HPI Karen Sharp is a 33 y.o. female with past medical history of GERD, constipation and GAD who presents for annual physical.  GAD: She felt better with as needed Vistaril 10 mg, and reports less drowsy the next day compared to 25 mg dose.  She denies anhedonia, SI or HI currently.  Work stress is manageable currently.  Hot flashes: She reports hot flashes and had blood tests with OB/GYN.  She was told of PCOS and was placed on OCPs, which has helped with irregular menstrual cycles.  Fatigue: She reports chronic fatigue.  Her last blood tests showed low vitamin D, but she reports that she completed vitamin D 50,000 IU qw supplement.  Denies any chronic cough, hemoptysis, nausea, vomiting, melena, hematochezia, fever, chills, recent weight loss, night sweats or LAD.    Past Medical History:  Diagnosis Date   Anxiety    Constipation    Depression     Past Surgical History:  Procedure Laterality Date   COLONOSCOPY  04/2021   one 3 mm polyp (hyperplastic), non-bleeding internal hemorrhoids   WISDOM TOOTH EXTRACTION      Family History  Problem Relation Age of Onset   Colon cancer Neg Hx    Colon polyps Neg Hx     Social History   Socioeconomic History   Marital status: Single    Spouse name: Not on file   Number of children: Not on file   Years of education: Not on file   Highest education level: Master's degree (e.g., MA, MS, MEng, MEd, MSW, MBA)  Occupational History   Occupation: Public librarian, Education officer, community  Tobacco Use   Smoking status: Never   Smokeless tobacco: Never  Vaping Use   Vaping status: Never Used  Substance and Sexual Activity   Alcohol use: Yes    Comment: occasionally   Drug use: Never   Sexual activity: Yes    Birth  control/protection: None  Other Topics Concern   Not on file  Social History Narrative   ** Merged History Encounter **       Social Drivers of Health   Financial Resource Strain: Low Risk  (11/03/2023)   Overall Financial Resource Strain (CARDIA)    Difficulty of Paying Living Expenses: Not very hard  Food Insecurity: No Food Insecurity (11/03/2023)   Hunger Vital Sign    Worried About Running Out of Food in the Last Year: Never true    Ran Out of Food in the Last Year: Never true  Transportation Needs: No Transportation Needs (11/03/2023)   PRAPARE - Administrator, Civil Service (Medical): No    Lack of Transportation (Non-Medical): No  Physical Activity: Unknown (11/03/2023)   Exercise Vital Sign    Days of Exercise per Week: 0 days    Minutes of Exercise per Session: Not on file  Stress: No Stress Concern Present (11/03/2023)   [employer] of Occupational Health - Occupational Stress Questionnaire    Feeling of Stress : Not at all  Social Connections: Moderately Isolated (11/03/2023)   Social Connection and Isolation Panel [NHANES]    Frequency of Communication with Friends and Family: More than three times a week    Frequency of Social Gatherings with Friends and Family: Once a  week    Attends Religious Services: More than 4 times per year    Active Member of Clubs or Organizations: No    Attends Banker Meetings: Not on file    Marital Status: Never married  Intimate Partner Violence: Not At Risk (10/01/2020)   Humiliation, Afraid, Rape, and Kick questionnaire    Fear of Current or Ex-Partner: No    Emotionally Abused: No    Physically Abused: No    Sexually Abused: No    Outpatient Medications Prior to Visit  Medication Sig Dispense Refill   albuterol (VENTOLIN HFA) 108 (90 Base) MCG/ACT inhaler Inhale 1-2 puffs into the lungs every 6 (six) hours as needed for wheezing or shortness of breath. 18 g 0   hydrocortisone (ANUSOL-HC) 2.5 % rectal cream  Place 1 Application rectally 2 (two) times daily. For flared hemorrhoids 30 g 1   hydrOXYzine (ATARAX) 10 MG tablet Take 1 tablet (10 mg total) by mouth 2 (two) times daily as needed for anxiety. 30 tablet 0   lidocaine (XYLOCAINE) 5 % ointment Apply 1 Application topically 4 (four) times daily as needed. To rectum for discomfort 30 g 0   linaclotide (LINZESS) 72 MCG capsule Take 1 capsule (72 mcg total) by mouth daily before breakfast. 90 capsule 3   Multiple Vitamin (MULTIVITAMIN) tablet Take 1 tablet by mouth daily.     SPRINTEC 28 0.25-35 MG-MCG tablet Take 1 tablet by mouth daily.     Vitamin D, Ergocalciferol, (DRISDOL) 1.25 MG (50000 UNIT) CAPS capsule Take 1 capsule (50,000 Units total) by mouth every 7 (seven) days. 12 capsule 1   No facility-administered medications prior to visit.    Allergies  Allergen Reactions   Dog Epithelium (Canis Lupus Familiaris) Hives and Rash    ROS Review of Systems  Constitutional:  Negative for chills and fever.  HENT:  Negative for congestion, sinus pressure, sinus pain and sore throat.   Eyes:  Negative for pain and discharge.  Respiratory:  Negative for cough and shortness of breath.   Cardiovascular:  Negative for chest pain and palpitations.  Gastrointestinal:  Positive for constipation. Negative for diarrhea, nausea and vomiting.  Endocrine: Negative for polydipsia and polyuria.  Genitourinary:  Negative for dysuria and frequency.  Musculoskeletal:  Negative for neck pain and neck stiffness.  Skin:  Negative for rash.  Neurological:  Negative for dizziness and weakness.  Psychiatric/Behavioral:  Negative for agitation and behavioral problems. The patient is nervous/anxious.       Objective:    Physical Exam Vitals reviewed.  Constitutional:      General: She is not in acute distress.    Appearance: She is not diaphoretic.  HENT:     Head: Normocephalic and atraumatic.     Nose: Nose normal.  Eyes:     General: No scleral  icterus.    Extraocular Movements: Extraocular movements intact.  Neck:     Thyroid: No thyroid tenderness.     Comments: About 1 cm in diameter nontender mass in the posterior cervical lymph node area Cardiovascular:     Rate and Rhythm: Normal rate and regular rhythm.     Heart sounds: Normal heart sounds. No murmur heard. Pulmonary:     Breath sounds: Normal breath sounds. No wheezing or rales.  Abdominal:     Palpations: Abdomen is soft.     Tenderness: There is no abdominal tenderness.  Musculoskeletal:     Cervical back: Neck supple. No tenderness.  Right lower leg: No edema.     Left lower leg: No edema.  Skin:    General: Skin is warm.     Findings: No rash.  Neurological:     General: No focal deficit present.     Mental Status: She is alert and oriented to person, place, and time.     Cranial Nerves: No cranial nerve deficit.     Sensory: No sensory deficit.     Motor: No weakness.  Psychiatric:        Mood and Affect: Mood normal.        Behavior: Behavior normal.     BP 105/70 (BP Location: Left Arm, Patient Position: Sitting, Cuff Size: Normal)   Pulse (!) 109   Ht 5\' 3"  (1.6 m)   Wt 161 lb 9.6 oz (73.3 kg)   SpO2 97%   BMI 28.63 kg/m  Wt Readings from Last 3 Encounters:  11/04/23 161 lb 9.6 oz (73.3 kg)  05/07/23 160 lb 6.4 oz (72.8 kg)  12/16/22 158 lb 6.4 oz (71.8 kg)    Lab Results  Component Value Date   TSH 2.390 11/04/2022   Lab Results  Component Value Date   WBC 9.6 11/04/2022   HGB 12.8 11/04/2022   HCT 39.0 11/04/2022   MCV 88 11/04/2022   PLT 263 11/04/2022   Lab Results  Component Value Date   NA 138 11/04/2022   K 4.1 11/04/2022   CO2 20 11/04/2022   GLUCOSE 87 11/04/2022   BUN 9 11/04/2022   CREATININE 0.60 11/04/2022   BILITOT <0.2 11/04/2022   ALKPHOS 63 11/04/2022   AST 20 11/04/2022   ALT 15 11/04/2022   PROT 6.9 11/04/2022   ALBUMIN 4.2 11/04/2022   CALCIUM 9.3 11/04/2022   ANIONGAP 7 03/27/2020   EGFR 125  11/04/2022   Lab Results  Component Value Date   CHOL 153 11/04/2022   Lab Results  Component Value Date   HDL 53 11/04/2022   Lab Results  Component Value Date   LDLCALC 79 11/04/2022   Lab Results  Component Value Date   TRIG 119 11/04/2022   Lab Results  Component Value Date   CHOLHDL 2.9 11/04/2022   No results found for: "HGBA1C"    Assessment & Plan:   Problem List Items Addressed This Visit       Endocrine   PCOS (polycystic ovarian syndrome)   Followed by Ob.Gyn. On OCPs Has hot flashes, unclear if related to PCOS TSH wnl        Genitourinary   Microscopic hematuria   Has had urology eval, CT abdomen and US abdomen negative for renal mass, stone or hydronephrosis Reports having normal cystoscopy as well        Other   Constipation   Her abdominal discomfort was likely due to chronic constipation Referred to GI - started Linzess, now better controlled Maintain adequate hydration      Relevant Orders   CMP14+EGFR   CBC with Differential/Platelet   Encounter for general adult medical examination with abnormal findings - Primary   Physical exam as documented. Fasting blood tests ordered. DMV forms signed.      GAD (generalized anxiety disorder)   Had facial flushing and chest tightness, seem to be due to GAD - now resolved Has tried SSRI/SNRI in the past, but did not tolerate it Had started Vistaril 25 mg PRN,, but had drowsiness -decreased dose to 10 mg as needed - tolerating better Referred to [facility]  therapy      Relevant Orders   Ambulatory referral to Psychology   TSH   CMP14+EGFR   CBC with Differential/Platelet   Vitamin D deficiency   Last vitamin D Lab Results  Component Value Date   VD25OH 17.4 (L) 11/04/2022   Fatigue could be due to nutritional deficiency Completed vitamin D 50,000 IU qw for 6 months      Relevant Orders   VITAMIN D 25 Hydroxy (Vit-D Deficiency, Fractures)   Other Visit Diagnoses       Mixed  hyperlipidemia       Relevant Orders   Lipid panel     B12 deficiency       Relevant Orders   B12        No orders of the defined types were placed in this encounter.   Follow-up: Return in about 1 year (around 11/03/2024).    Anabel Halon, MD

## 2023-11-05 ENCOUNTER — Encounter: Payer: Self-pay | Admitting: Internal Medicine

## 2023-11-05 LAB — CMP14+EGFR
ALT: 14 IU/L (ref 0–32)
AST: 20 IU/L (ref 0–40)
Albumin: 4.4 g/dL (ref 4.0–5.0)
Alkaline Phosphatase: 77 IU/L (ref 44–121)
BUN/Creatinine Ratio: 12 (ref 9–23)
BUN: 9 mg/dL (ref 6–20)
Bilirubin Total: <0.2 mg/dL (ref 0.0–1.2)
CO2: 23 mmol/L (ref 20–29)
Calcium: 9.6 mg/dL (ref 8.7–10.2)
Chloride: 104 mmol/L (ref 96–106)
Creatinine, Ser: 0.76 mg/dL (ref 0.57–1.00)
Globulin, Total: 2.5 g/dL (ref 1.5–4.5)
Glucose: 90 mg/dL (ref 70–99)
Potassium: 4.2 mmol/L (ref 3.5–5.2)
Sodium: 141 mmol/L (ref 134–144)
Total Protein: 6.9 g/dL (ref 6.0–8.5)
eGFR: 108 mL/min/{1.73_m2} (ref >59)

## 2023-11-05 LAB — CBC WITH DIFFERENTIAL/PLATELET
Basophils Absolute: 0 10*3/uL (ref 0.0–0.2)
Basos: 0 %
EOS (ABSOLUTE): 0.1 10*3/uL (ref 0.0–0.4)
Eos: 1 %
Hematocrit: 37.7 % (ref 34.0–46.6)
Hemoglobin: 12.5 g/dL (ref 11.1–15.9)
Immature Grans (Abs): 0 10*3/uL (ref 0.0–0.1)
Immature Granulocytes: 0 %
Lymphocytes Absolute: 2.4 10*3/uL (ref 0.7–3.1)
Lymphs: 29 %
MCH: 29.4 pg (ref 26.6–33.0)
MCHC: 33.2 g/dL (ref 31.5–35.7)
MCV: 89 fL (ref 79–97)
Monocytes Absolute: 0.5 10*3/uL (ref 0.1–0.9)
Monocytes: 6 %
Neutrophils Absolute: 5.3 10*3/uL (ref 1.4–7.0)
Neutrophils: 64 %
Platelets: 288 10*3/uL (ref 150–450)
RBC: 4.25 x10E6/uL (ref 3.77–5.28)
RDW: 12.7 % (ref 11.7–15.4)
WBC: 8.2 10*3/uL (ref 3.4–10.8)

## 2023-11-05 LAB — LIPID PANEL
Chol/HDL Ratio: 3.1 ratio (ref 0.0–4.4)
Cholesterol, Total: 152 mg/dL (ref 100–199)
HDL: 49 mg/dL (ref >39)
LDL Chol Calc (NIH): 83 mg/dL (ref 0–99)
Triglycerides: 111 mg/dL (ref 0–149)
VLDL Cholesterol Cal: 20 mg/dL (ref 5–40)

## 2023-11-05 LAB — TSH: TSH: 2.53 u[IU]/mL (ref 0.450–4.500)

## 2023-11-05 LAB — VITAMIN D 25 HYDROXY (VIT D DEFICIENCY, FRACTURES): Vit D, 25-Hydroxy: 61.5 ng/mL (ref 30.0–100.0)

## 2023-11-05 LAB — VITAMIN B12: Vitamin B-12: 566 pg/mL (ref 232–1245)

## 2023-11-13 ENCOUNTER — Other Ambulatory Visit (HOSPITAL_COMMUNITY)
Admission: RE | Admit: 2023-11-13 | Discharge: 2023-11-13 | Disposition: A | Discharge status: Home / Self Care | Source: Ambulatory Visit | Attending: Obstetrics and Gynecology | Admitting: Obstetrics and Gynecology

## 2023-11-13 ENCOUNTER — Other Ambulatory Visit: Payer: Self-pay | Admitting: Obstetrics and Gynecology

## 2023-11-13 DIAGNOSIS — Z01419 Encounter for gynecological examination (general) (routine) without abnormal findings: Secondary | ICD-10-CM | POA: Diagnosis present

## 2023-11-16 ENCOUNTER — Other Ambulatory Visit: Payer: Self-pay | Admitting: Gastroenterology

## 2023-11-17 LAB — CYTOLOGY - PAP
Comment: NEGATIVE
Diagnosis: NEGATIVE
Diagnosis: REACTIVE
High risk HPV: NEGATIVE

## 2024-01-11 ENCOUNTER — Other Ambulatory Visit: Payer: Self-pay | Admitting: Gastroenterology

## 2024-01-12 ENCOUNTER — Telehealth: Payer: Self-pay

## 2024-01-12 NOTE — Telephone Encounter (Signed)
 Phoned the pt and advised we sent  in a refill on 11/17/2023 under the assumption that she would come in for an appt. Pt did not come for appt / schedule. I explained to her she has to have a appt before anymore could be given. Offered to send to front for appt but she said she would call back.

## 2024-02-11 ENCOUNTER — Telehealth: Admitting: Gastroenterology

## 2024-11-03 ENCOUNTER — Ambulatory Visit: Admitting: Internal Medicine
